# Patient Record
Sex: Female | Born: 1950 | Race: White | Hispanic: No | Smoking: Never smoker
Health system: Southern US, Academic
[De-identification: ages and names within clinical notes are randomized; demographics above are authoritative.]

## PROBLEM LIST (undated history)

## (undated) DIAGNOSIS — I10 Essential (primary) hypertension: Secondary | ICD-10-CM

## (undated) DIAGNOSIS — E079 Disorder of thyroid, unspecified: Secondary | ICD-10-CM

## (undated) DIAGNOSIS — H9190 Unspecified hearing loss, unspecified ear: Secondary | ICD-10-CM

## (undated) DIAGNOSIS — G4730 Sleep apnea, unspecified: Secondary | ICD-10-CM

## (undated) DIAGNOSIS — I2510 Atherosclerotic heart disease of native coronary artery without angina pectoris: Secondary | ICD-10-CM

## (undated) DIAGNOSIS — K219 Gastro-esophageal reflux disease without esophagitis: Secondary | ICD-10-CM

## (undated) HISTORY — DX: Atherosclerotic heart disease of native coronary artery without angina pectoris: I25.10

## (undated) HISTORY — DX: Sleep apnea, unspecified: G47.30

## (undated) HISTORY — DX: Unspecified hearing loss, unspecified ear: H91.90

## (undated) HISTORY — DX: Gastro-esophageal reflux disease without esophagitis: K21.9

## (undated) HISTORY — DX: Disorder of thyroid, unspecified: E07.9

## (undated) HISTORY — DX: Essential (primary) hypertension: I10

## (undated) HISTORY — PX: HX TUBAL LIGATION: SHX77

## (undated) HISTORY — PX: HX HYSTERECTOMY: SHX81

---

## 1992-10-20 ENCOUNTER — Other Ambulatory Visit (HOSPITAL_COMMUNITY): Payer: Self-pay

## 2022-03-09 IMAGING — MR MRI CERVICAL SPINE WITHOUT CONTRAST
4 of 6 series · 23 of 48 positions shown · non-contrast
Comparison: None previous.

﻿EXAM:  16737   MRI CERVICAL SPINE WITHOUT CONTRAST
INDICATION: 71-year-old with neck pain and bilateral shoulder pain.  Clinical diagnosis cervical myelopathy.
TECHNIQUE: Coronal, sagittal and axial images following standard protocol.  Some of the axial images are compromised in quality due to motion artifacts.  Overall quality is acceptable.

[Series 6: T2 · sagittal · 3.0mm · 0.75mm/px · 7 of 13 slices shown (1 of 2)]
[im 1/13]
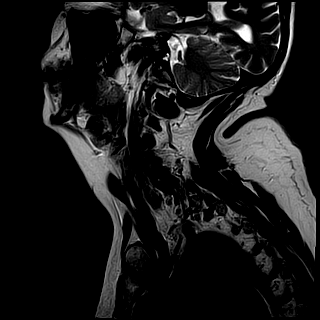
[im 3/13]
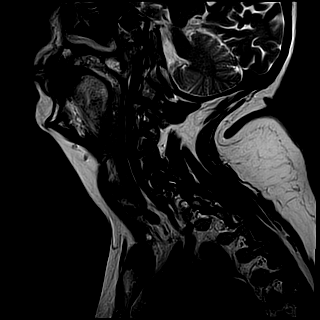
[im 5/13]
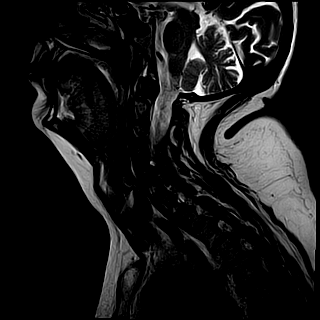
[im 7/13]
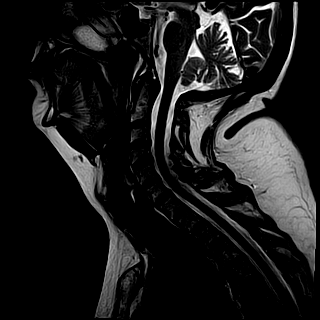
[im 9/13]
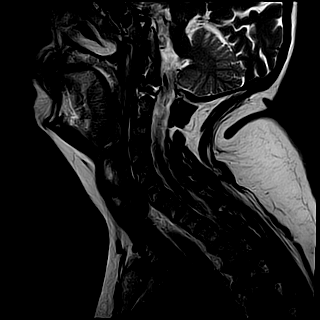
[im 11/13]
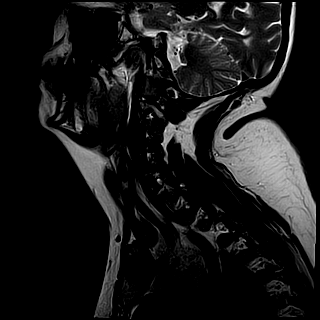
[im 13/13]
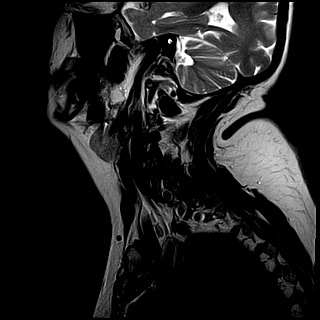

[Series 7: T1 · sagittal · 3.0mm · 0.47mm/px · 4 of 13 slices shown]
[im 1/13]
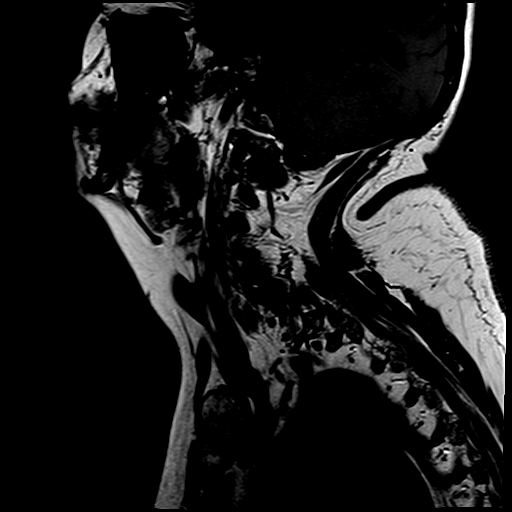
[im 3/13]
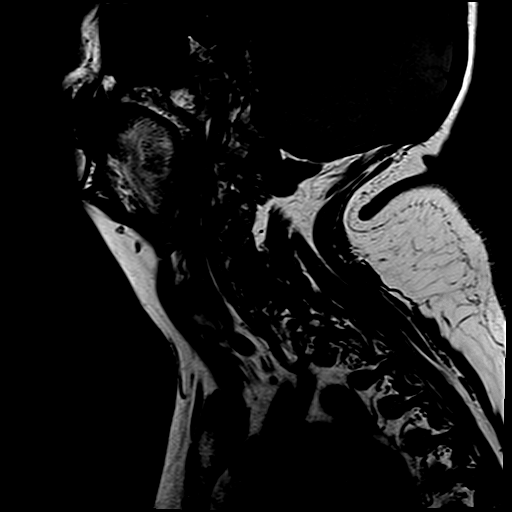
[im 7/13]
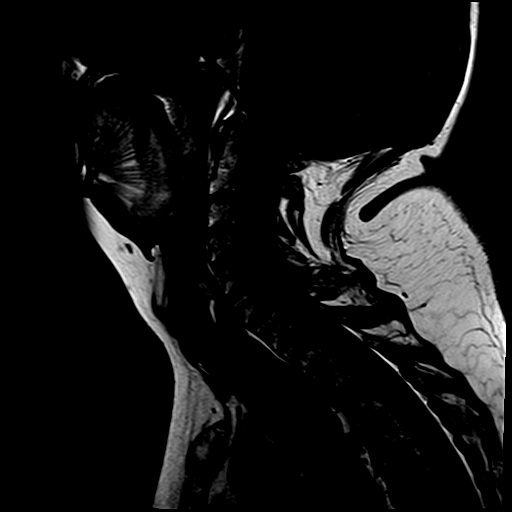
[im 11/13]
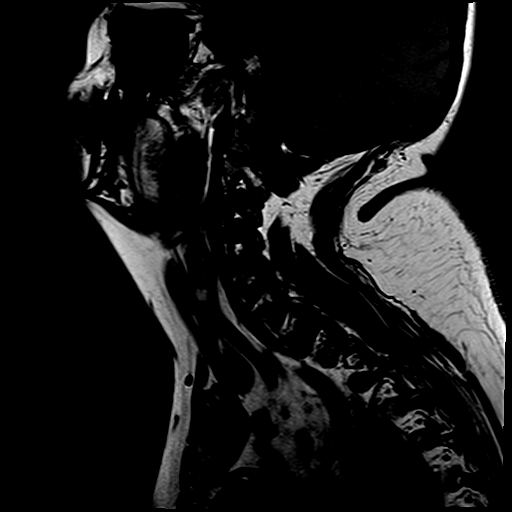

[Series 8: STIR · sagittal · 3.0mm · 0.47mm/px · 3 of 13 slices shown]
[im 3/13]
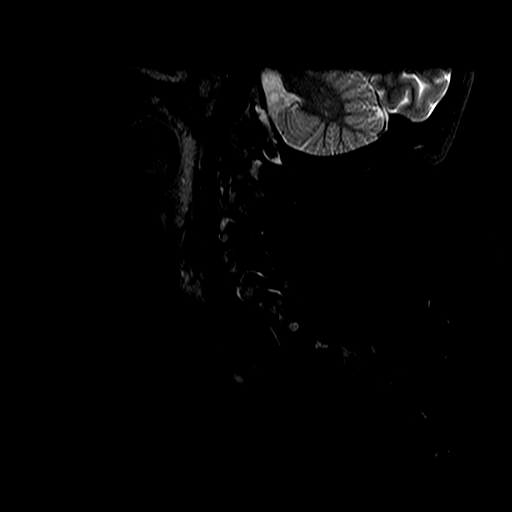
[im 7/13]
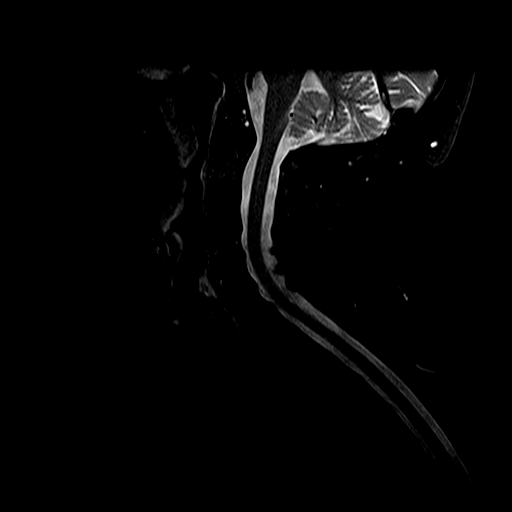
[im 11/13]
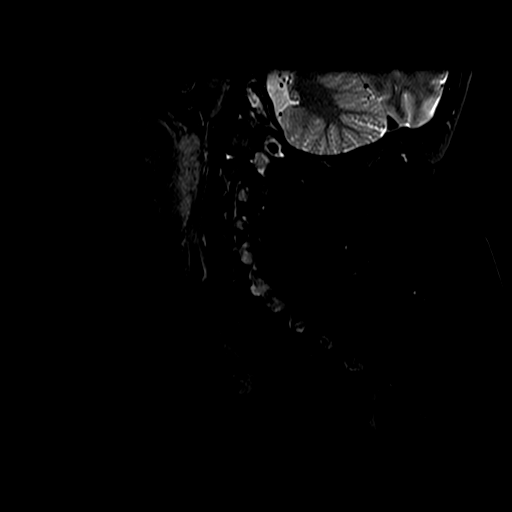

[Series 9: T2 · oblique · 3.0mm · 0.39mm/px · 9 of 18 slices shown (2 of 2)]
[im 1/18]
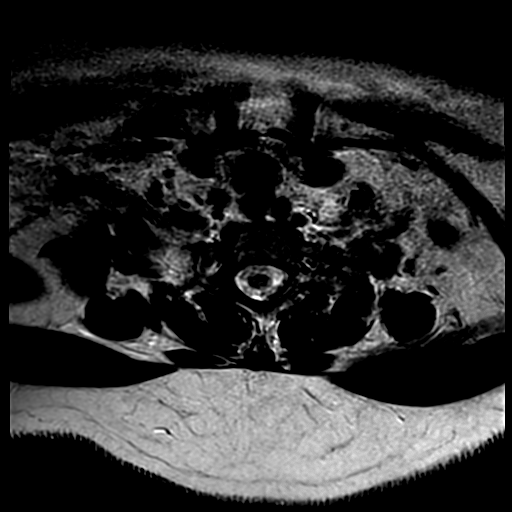
[im 3/18]
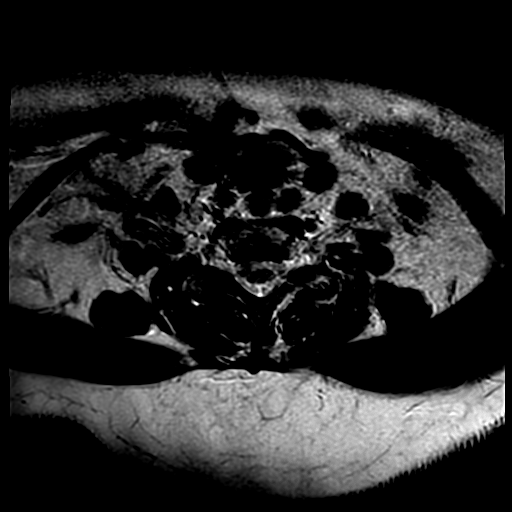
[im 5/18]
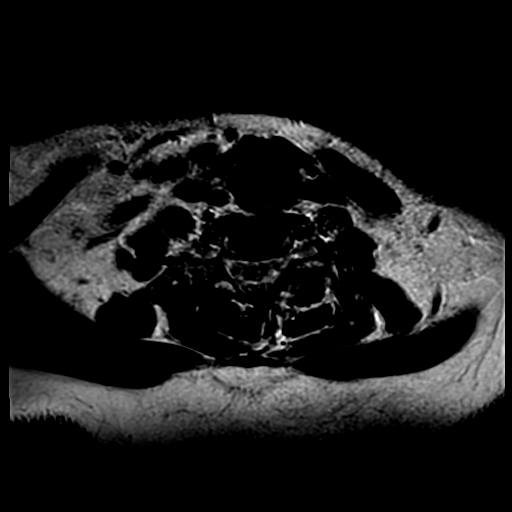
[im 7/18]
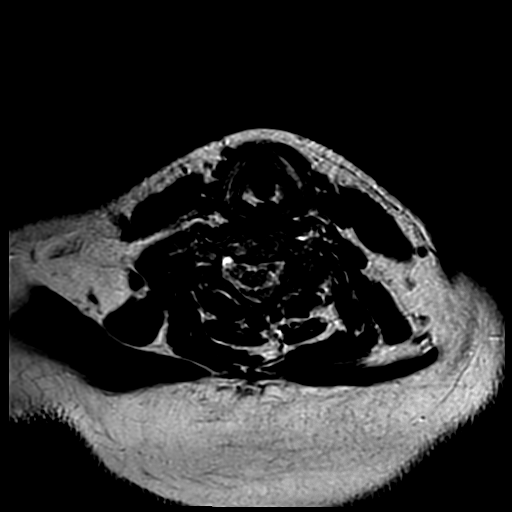
[im 9/18]
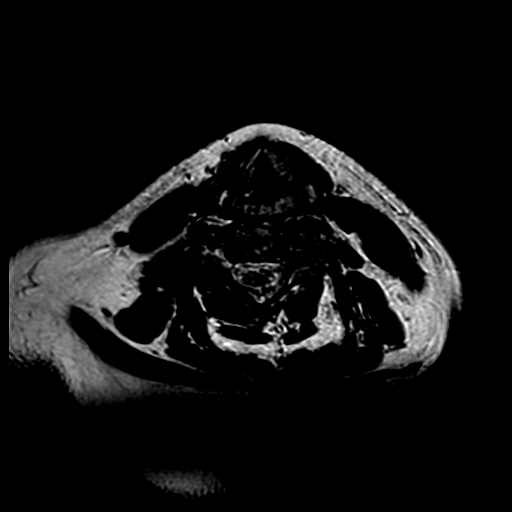
[im 11/18]
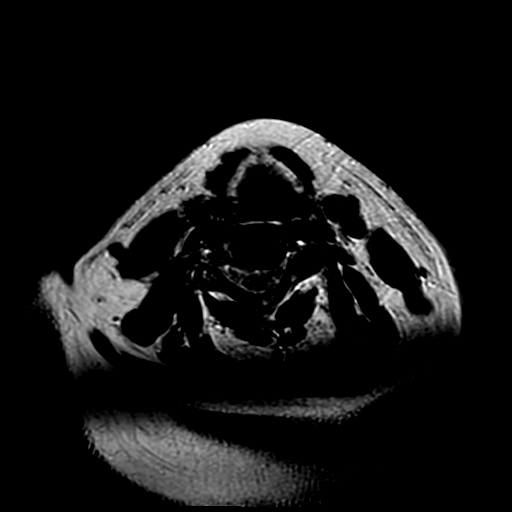
[im 13/18]
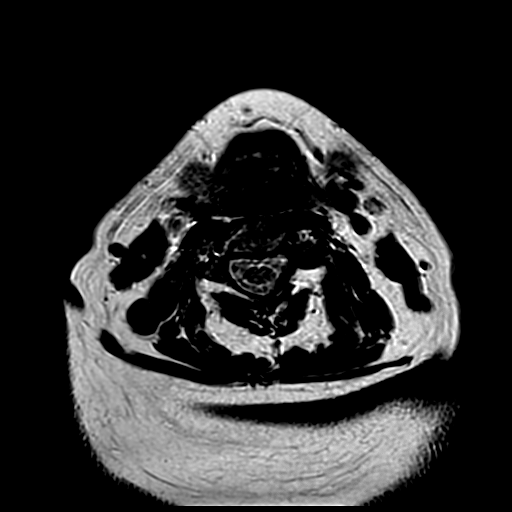
[im 15/18]
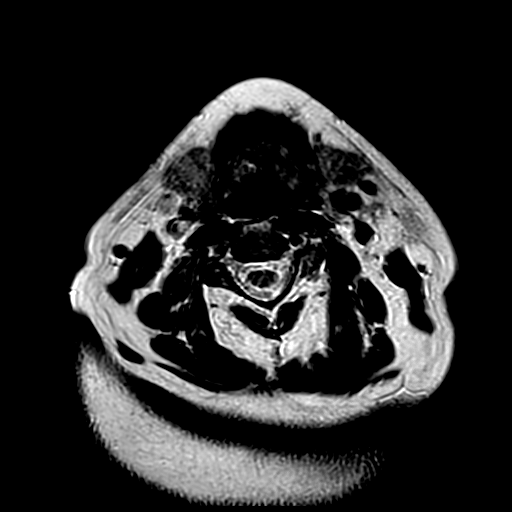
[im 18/18]
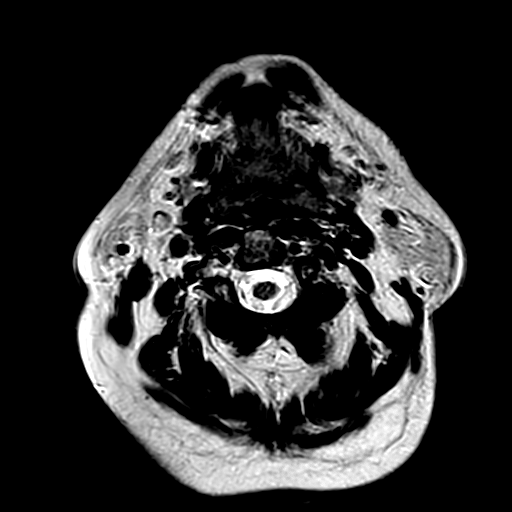

[23 of 48 positions shown; findings below may reference images not displayed]

FINDINGS: No acute bony lesions of cervical vertebrae.  Posterior fossa and foramen magnum structures are normal in the sagittal projection. 

No significant disc pathology at C2-C3 level.  

At C3-C4 level, mild degenerative disc disease without significant compromise of thecal sac.  Mild left foraminal narrowing is noted.  

At C4-C5 level, degenerative disc disease is causing moderate compromise of left neural foramen due to asymmetric bulging annulus.  

At C5-C6 level, degenerative disc disease with asymmetric bulging annulus causing mild left foraminal narrowing.  

At C6-C7 level, no focal disc lesions are seen.  

C7-T1 disc is normal.  

Cervical spinal cord shows no focal evidence of myelopathy or syrinx formation.  Paravertebral soft tissues are unremarkable.
IMPRESSION: 1. No MRI evidence of focal cervical cord myelopathy or cord syrinx formation. 

2. Degenerative changes at multiple levels as described above in detail at each level with mild to moderate foraminal narrowing on the left side as described above. 

3. Paravertebral soft tissues are unremarkable. 

Electronically Signed by GU, PITITTO at [id number] [DATE]

## 2022-03-22 IMAGING — MR MRI BRAIN W/O CONTRAST
9 series · 48 of 48 positions shown · non-contrast
Comparison: MRI C-spine dated 03/09/2022.

﻿EXAM:  MRI BRAIN W/O CONTRAST
INDICATION: 71-year-old female with memory impairment.  Ataxia.  Bilateral arm numbness.
TECHNIQUE: Multiplanar, multisequential MRI of the brain was performed without administration of contrast.

[Series 5: DWI · axial · 5.0mm · 1.35mm/px · z∈[-45,+81]mm · 16 of 88 slices shown (1 of 3)]
[im 1/88]
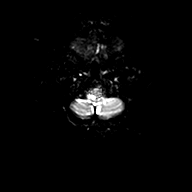
[im 6/88]
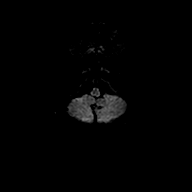
[im 12/88]
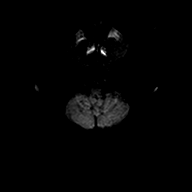
[im 18/88]
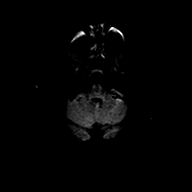
[im 24/88]
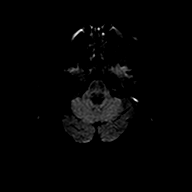
[im 30/88]
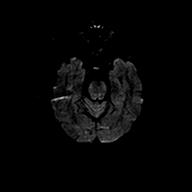
[im 35/88]
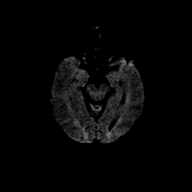
[im 41/88]
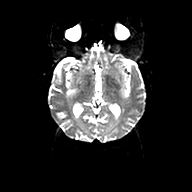
[im 47/88]
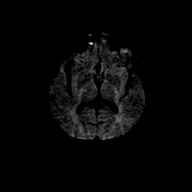
[im 53/88]
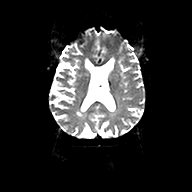
[im 59/88]
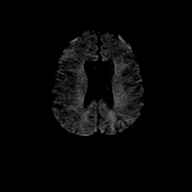
[im 64/88]
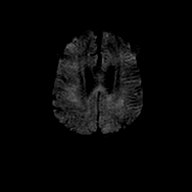
[im 70/88]
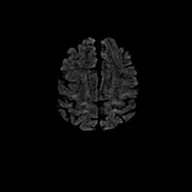
[im 76/88]
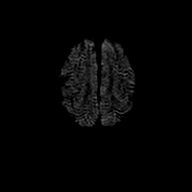
[im 82/88]
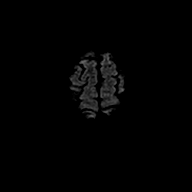
[im 88/88]
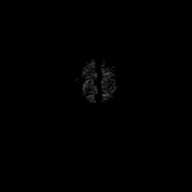

[Series 6: DWI · axial · 5.0mm · 1.35mm/px · z∈[-45,+81]mm · 4 of 22 slices shown (2 of 3)]
[im 1/22]
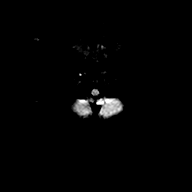
[im 8/22]
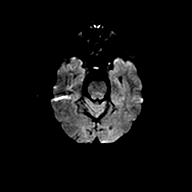
[im 15/22]
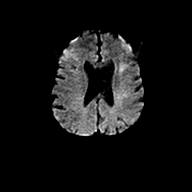
[im 22/22]
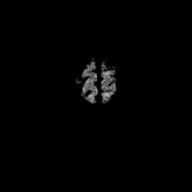

[Series 7: DWI · axial · 5.0mm · 1.35mm/px · z∈[-45,+81]mm · 4 of 22 slices shown (3 of 3)]
[im 1/22]
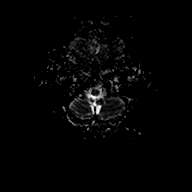
[im 8/22]
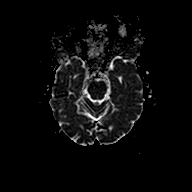
[im 15/22]
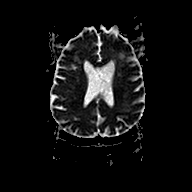
[im 22/22]
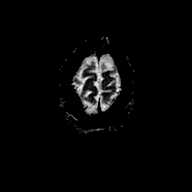

[Series 8: FLAIR · sagittal · 4.0mm · 0.75mm/px · 4 of 26 slices shown (1 of 2)]
[im 1/26]
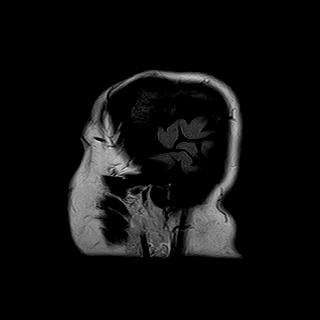
[im 9/26]
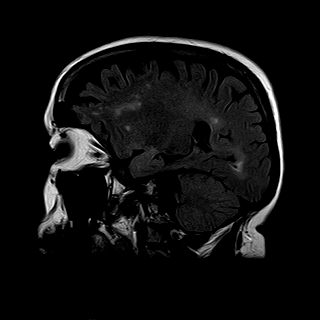
[im 17/26]
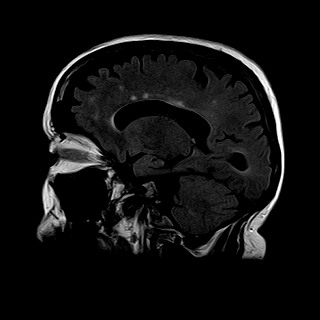
[im 26/26]
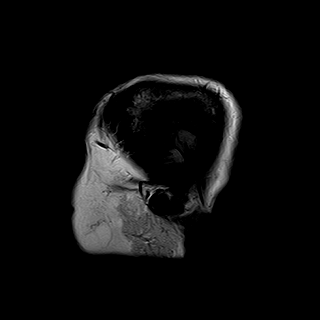

[Series 9: T2 · axial · 5.0mm · 0.43mm/px · z∈[-33,+109]mm · 4 of 25 slices shown (1 of 2)]
[im 1/25]
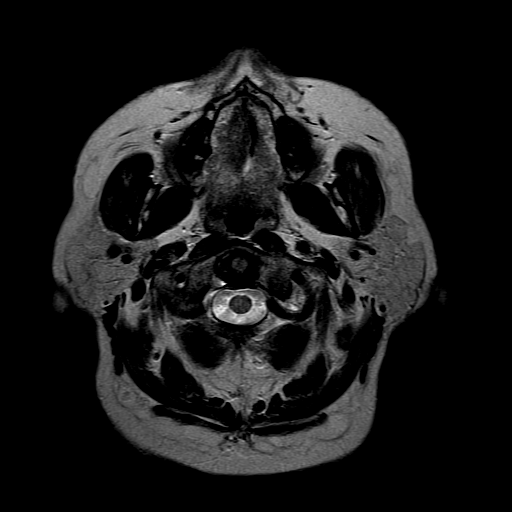
[im 9/25]
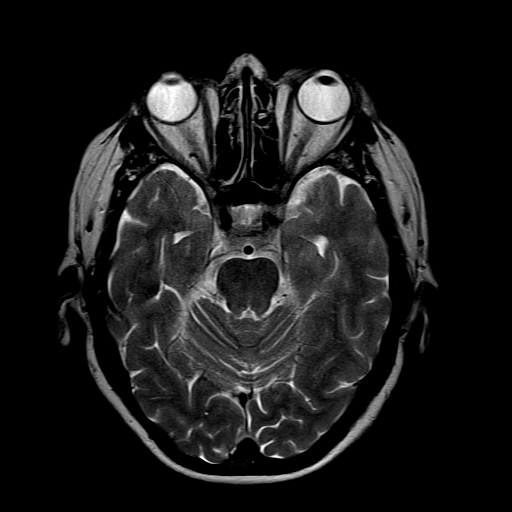
[im 17/25]
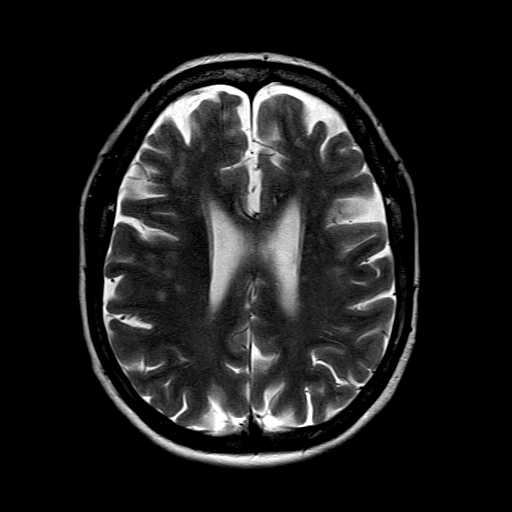
[im 25/25]
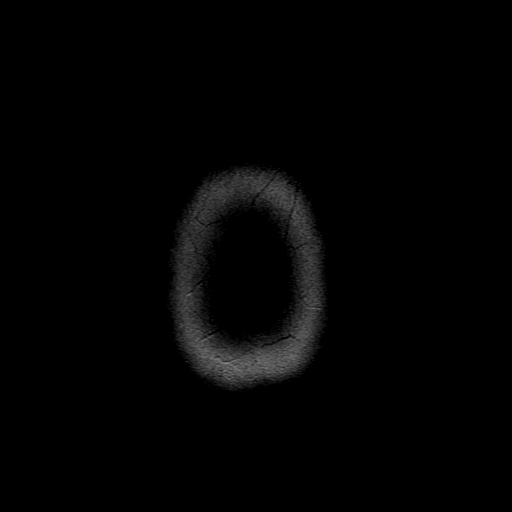

[Series 10: FLAIR · axial · 5.0mm · 0.76mm/px · z∈[-24,+100]mm · 4 of 22 slices shown (2 of 2)]
[im 1/22]
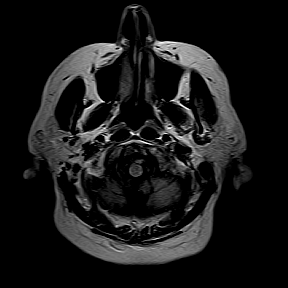
[im 8/22]
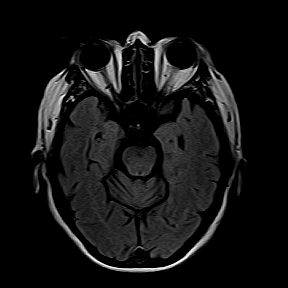
[im 15/22]
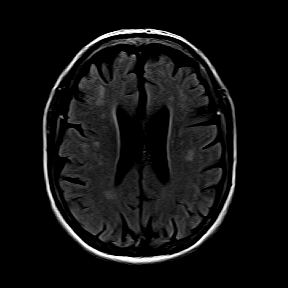
[im 22/22]
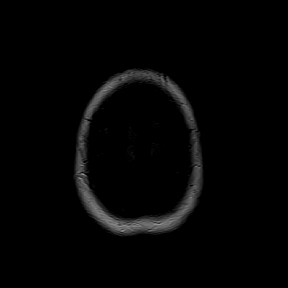

[Series 13: T1 · axial · 5.0mm · 0.69mm/px · z∈[-33,+109]mm · 4 of 25 slices shown]
[im 1/25]
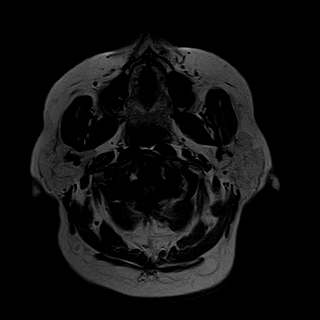
[im 9/25]
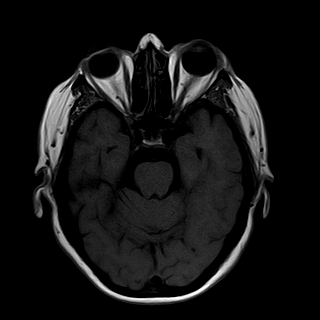
[im 17/25]
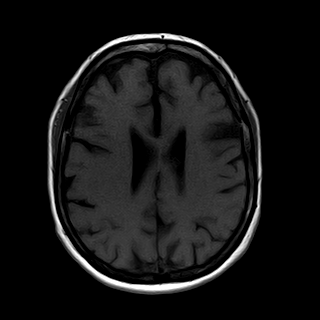
[im 25/25]
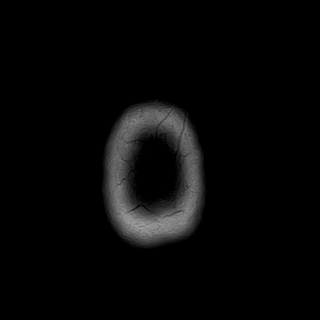

[Series 14: T2-star · axial · 5.0mm · 0.69mm/px · z∈[-33,+109]mm · 4 of 25 slices shown]
[im 1/25]
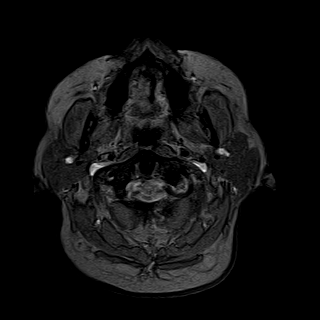
[im 9/25]
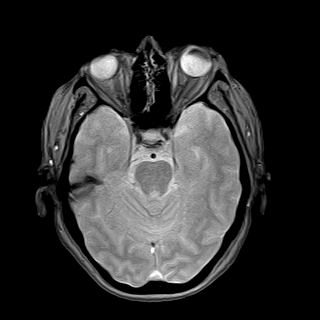
[im 17/25]
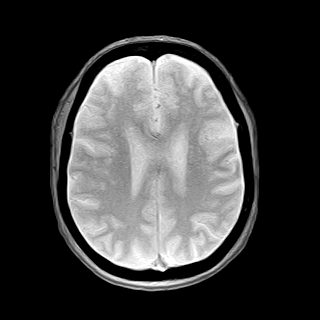
[im 25/25]
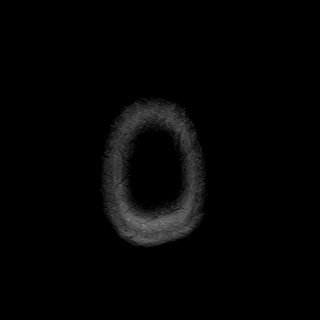

[Series 15: T2 · coronal · 6.0mm · 0.43mm/px · 4 of 24 slices shown (2 of 2)]
[im 1/24]
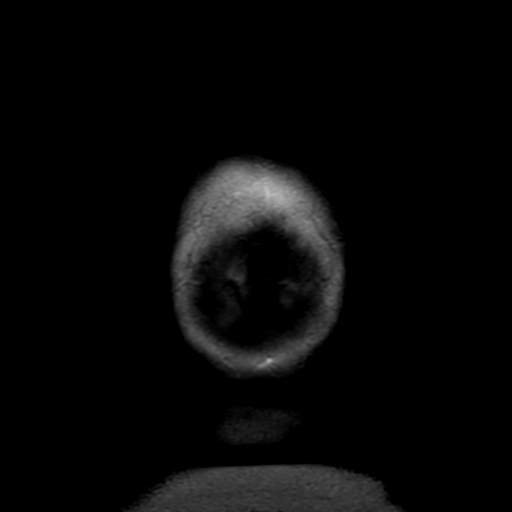
[im 8/24]
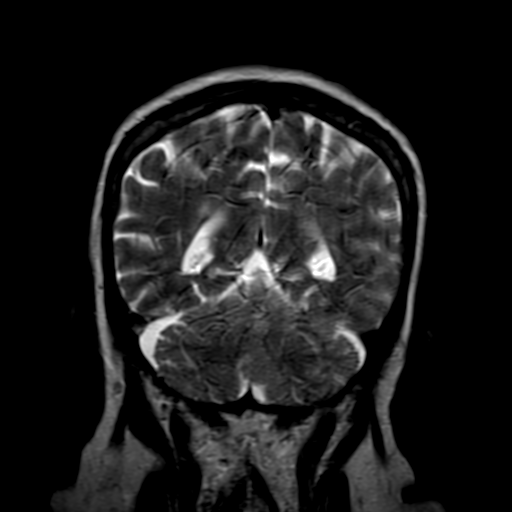
[im 16/24]
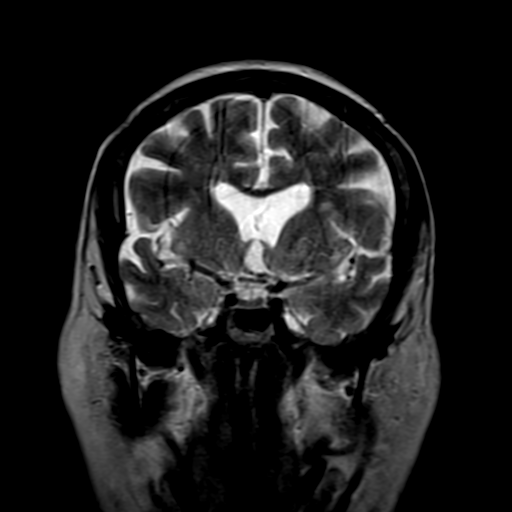
[im 24/24]
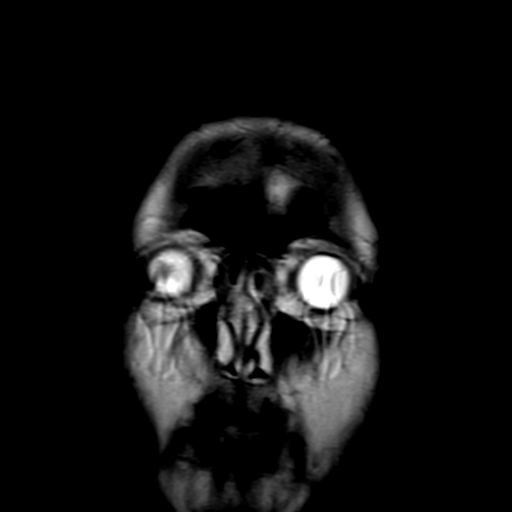

[48 of 48 positions shown; findings below may reference images not displayed]

FINDINGS: No focal areas of restricted diffusion on diffusion images.

On the sagittal and FLAIR sequence, extensive bilateral subcortical and periventricular FLAIR signal lesions are seen suggestive of likely bilateral chronic small-vessel ischemic change.  Lesions are not in typical pattern of multiple sclerosis.

Age-appropriate cerebral atrophy and cerebellar atrophy are noted.  No ventriculomegaly or midline shift.

Major arteries of circle of Willis and dural venous sinuses are patent.
IMPRESSION: 1. No intracranial space-occupying lesions or ventriculomegaly.  Age-appropriate symmetric cerebral and cerebellar atrophy. 

2. Bilateral FLAIR bright lesions in the subcortical periventricular white matter likely due to small-vessel ischemic changes.  No typical changes of multiple sclerosis are seen.

3. Major arteries of circle of Willis and dural venous sinuses are patent.

## 2022-06-05 ENCOUNTER — Ambulatory Visit (HOSPITAL_PSYCHIATRIC): Payer: Self-pay | Admitting: PHYSICIAN ASSISTANT

## 2022-06-30 ENCOUNTER — Encounter (HOSPITAL_PSYCHIATRIC): Payer: Self-pay | Admitting: PHYSICIAN ASSISTANT

## 2022-06-30 ENCOUNTER — Ambulatory Visit: Payer: Medicare Other | Attending: PHYSICIAN ASSISTANT | Admitting: PHYSICIAN ASSISTANT

## 2022-06-30 ENCOUNTER — Other Ambulatory Visit: Payer: Self-pay

## 2022-06-30 VITALS — BP 135/55 | HR 84 | Resp 18 | Ht 61.02 in | Wt 156.0 lb

## 2022-06-30 DIAGNOSIS — F339 Major depressive disorder, recurrent, unspecified: Secondary | ICD-10-CM | POA: Insufficient documentation

## 2022-06-30 DIAGNOSIS — Z79899 Other long term (current) drug therapy: Secondary | ICD-10-CM | POA: Insufficient documentation

## 2022-06-30 DIAGNOSIS — F411 Generalized anxiety disorder: Secondary | ICD-10-CM | POA: Insufficient documentation

## 2022-06-30 DIAGNOSIS — F331 Major depressive disorder, recurrent, moderate: Secondary | ICD-10-CM | POA: Insufficient documentation

## 2022-06-30 MED ORDER — BUSPIRONE 15 MG TABLET
15.0000 mg | ORAL_TABLET | Freq: Three times a day (TID) | ORAL | 1 refills | Status: DC
Start: 2022-06-30 — End: 2022-10-16

## 2022-06-30 MED ORDER — ARIPIPRAZOLE 2 MG TABLET
2.0000 mg | ORAL_TABLET | Freq: Every day | ORAL | 1 refills | Status: DC
Start: 2022-06-30 — End: 2022-10-16

## 2022-06-30 MED ORDER — DULOXETINE 60 MG CAPSULE,DELAYED RELEASE
60.0000 mg | DELAYED_RELEASE_CAPSULE | Freq: Every day | ORAL | 1 refills | Status: DC
Start: 2022-06-30 — End: 2022-10-16

## 2022-06-30 MED ORDER — LORAZEPAM 1 MG TABLET
1.0000 mg | ORAL_TABLET | Freq: Three times a day (TID) | ORAL | 2 refills | Status: DC
Start: 2022-06-30 — End: 2022-10-16

## 2022-06-30 NOTE — Patient Instructions (Signed)
Take medications as prescribed, avoid drugs and alcohol, call office if symptoms worsen or problems arise.  [phone].
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
## 2022-09-27 ENCOUNTER — Other Ambulatory Visit: Payer: Self-pay

## 2022-09-27 ENCOUNTER — Encounter (INDEPENDENT_AMBULATORY_CARE_PROVIDER_SITE_OTHER): Payer: Self-pay | Admitting: OTOLARYNGOLOGY

## 2022-09-27 ENCOUNTER — Ambulatory Visit (INDEPENDENT_AMBULATORY_CARE_PROVIDER_SITE_OTHER): Payer: Medicare Other | Admitting: OTOLARYNGOLOGY

## 2022-09-27 VITALS — Ht 61.0 in | Wt 155.0 lb

## 2022-09-27 DIAGNOSIS — J3089 Other allergic rhinitis: Secondary | ICD-10-CM

## 2022-09-27 DIAGNOSIS — R1310 Dysphagia, unspecified: Secondary | ICD-10-CM

## 2022-09-27 DIAGNOSIS — K219 Gastro-esophageal reflux disease without esophagitis: Secondary | ICD-10-CM

## 2022-09-27 NOTE — H&P (Signed)
ENT, [facility]  [address]  [place] [address]        Name: Jenna Gibson MRN:  [id number]   Date: 09/27/2022 DOB: Oct 03, 1950 (72 y.o.)       Referring Provider:  No ref. provider found    Reason for Visit:   Chief Complaint   Patient presents with    Dysphagia     New patient here for occ dysphagia. Reports feeling of getting stuck in thoat        History of Present Illness:  Jenna Gibson is a 72 y.o. female who is referred for eval of throat. Pt c/o dysphagia to solids x 2-3 months. Asso with severe heartburn with pain radiaiting across her chest. Taking Rolaids PRN. She used to take Pepcid and Protonix but ran out more than a year ago. She had an EGD 4-5 years ago and states she needs another.   Denies hoarseness      Patient History:  Patient Active Problem List   Diagnosis    Recurrent major depressive disorder (CMS HCC)    GAD (generalized anxiety disorder)     Current Outpatient Medications   Medication Sig    ARIPiprazole (ABILIFY) 2 mg Oral Tablet Take 1 Tablet (2 mg total) by mouth Once a day    atorvastatin (LIPITOR) 10 mg Oral Tablet Take 1 Tablet (10 mg total) by mouth Once a day    busPIRone (BUSPAR) 15 mg Oral Tablet Take 1 Tablet (15 mg total) by mouth Three times a day    clopidogreL (PLAVIX) 75 mg Oral Tablet Take 1 Tablet (75 mg total) by mouth Once a day    cyclobenzaprine (FLEXERIL) 10 mg Oral Tablet Take 1 Tablet (10 mg total) by mouth Every 8 hours as needed for Muscle spasms    dilTIAZem (CARDIZEM CD) 180 mg Oral Capsule, Sust. Release 24 hr Take 1 Capsule (180 mg total) by mouth Once a day    DULoxetine (CYMBALTA DR) 60 mg Oral Capsule, Delayed Release(E.C.) Take 1 Capsule (60 mg total) by mouth Once a day    famotidine (PEPCID) 40 mg Oral Tablet Take 1 Tablet (40 mg total) by mouth Every evening    furosemide (LASIX) 40 mg Oral Tablet Take 1 Tablet (40 mg total) by mouth Once a day    glimepiride (AMARYL) 2 mg Oral Tablet Take 1 Tablet (2 mg total) by mouth Every  morning with breakfast    Irbesartan-Hydrochlorothiazide 300-12.5 mg Oral Tablet Take 1 Tablet by mouth Once a day    levothyroxine (SYNTHROID) 50 mcg Oral Tablet Take 1 Tablet (50 mcg total) by mouth Every morning    LORazepam (ATIVAN) 1 mg Oral Tablet Take 1 Tablet (1 mg total) by mouth Three times a day    nitroGLYCERIN (NITROSTAT) 0.4 mg Sublingual Tablet, Sublingual Place 1 Tablet (0.4 mg total) under the tongue Every 5 minutes as needed for Chest pain for 3 doses over 15 minutes    ondansetron (ZOFRAN) 4 mg Oral Tablet Take 1 Tablet (4 mg total) by mouth Every 8 hours as needed    pantoprazole (PROTONIX) 40 mg Oral Tablet, Delayed Release (E.C.) Take 1 Tablet (40 mg total) by mouth Once a day    pregabalin (LYRICA) 75 mg Oral Capsule Take 1 Capsule (75 mg total) by mouth Twice daily      Allergies   Allergen Reactions    Butorphanol Mental Status Effect    Codeine Rash    Penicillins Rash  Sulfa (Sulfonamides) Rash and Nausea/ Vomiting    Crestor [Rosuvastatin]  Other Adverse Reaction (Add comment)     "Can't remember"    Penciclovir      Past Medical History:   Diagnosis Date    Coronary artery disease     Esophageal reflux     Essential hypertension     Hearing loss     Sleep apnea     Thyroid disease      Past Surgical History:   Procedure Laterality Date    HX HYSTERECTOMY      HX TUBAL LIGATION       Family Medical History:       Problem Relation (Age of Onset)    Cancer Other    Diabetes Other    Sleep apnea Other            Social History     Tobacco Use    Smoking status: Never    Smokeless tobacco: Never   Vaping Use    Vaping status: Never Used   Substance Use Topics    Alcohol use: Never    Drug use: Never       Review of Systems:  Review of Systems    Physical Exam:  Ht 1.549 m (5\' 1" )   Wt 70.3 kg (155 lb)   BMI 29.29 kg/m       Physical Exam  Constitutional:       Appearance: Normal appearance. She is well-developed, well-groomed and normal weight.   HENT:      Head: Normocephalic and  atraumatic.      Right Ear: Hearing, tympanic membrane, ear canal and external ear normal.      Left Ear: Hearing, tympanic membrane, ear canal and external ear normal.      Nose: Septal deviation and mucosal edema present.      Right Turbinates: Enlarged.      Left Turbinates: Enlarged.      Mouth/Throat:      Lips: Pink.      Mouth: Mucous membranes are moist.      Pharynx: Oropharynx is clear. Uvula midline.   Eyes:      Extraocular Movements: Extraocular movements intact.   Neck:      Trachea: Phonation normal.   Pulmonary:      Effort: Pulmonary effort is normal.   Musculoskeletal:      Cervical back: Normal range of motion and neck supple.   Lymphadenopathy:      Cervical: No cervical adenopathy.   Skin:     General: Skin is warm.   Neurological:      Mental Status: She is alert and oriented to person, place, and time.      Cranial Nerves: Cranial nerves 2-12 are intact. No facial asymmetry.   Psychiatric:         Attention and Perception: Attention normal.         Mood and Affect: Mood normal.         Speech: Speech normal.         Behavior: Behavior normal. Behavior is cooperative.          Assessment:  ENCOUNTER DIAGNOSES     ICD-10-CM   1. Dysphagia, unspecified type  R13.10   2. 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 Non-seasonal allergic rhinitis due to other allergic trigger  J30.89       Plan:  Medical records reviewed on 09/27/2022.  Rx Pantoprazole and Pepcid. Discussed antireflux precautions.  Order MBS/SLP eval  Refer to GI Dr. Allena Katz dysphagia.   Orders Placed This Encounter    31575 - LARYNGOSCOPY, FLEXIBLE DIAGNOSTIC (AMB ONLY)    FLUORO ESOPHAGRAM, MODIFIED SWALLOW    Referral to External Provider    famotidine (PEPCID) 40 mg Oral Tablet    pantoprazole (PROTONIX) 40 mg Oral Tablet, Delayed Release (E.C.)     Follow-up after MBS or sooner PRN    The advanced practice clinician's documentation was reviewed/amended in its entirety with the assessment and plan portion completely performed  independently by me during this encounter.   Lonia Farber, D.O., MMS    Marcelline Deist, PA-C  ENT / Facial Plastic Surgery    I appreciate the opportunity to be involved in the care of your patients.  If you have any questions or concerns regarding this encounter, please do not hesitate to contact me at your convenience.        This note may have been partially generated using MModal Fluency Direct system, and there may be some incorrect words, spellings, and punctuation that were not noted in checking the note before saving, though effort was made to avoid such errors.

## 2022-09-28 ENCOUNTER — Telehealth (INDEPENDENT_AMBULATORY_CARE_PROVIDER_SITE_OTHER): Payer: Self-pay | Admitting: OTOLARYNGOLOGY

## 2022-09-28 ENCOUNTER — Ambulatory Visit (HOSPITAL_PSYCHIATRIC): Payer: Medicare Other | Admitting: PHYSICIAN ASSISTANT

## 2022-09-28 MED ORDER — PANTOPRAZOLE 40 MG TABLET,DELAYED RELEASE
40.0000 mg | DELAYED_RELEASE_TABLET | Freq: Every day | ORAL | 3 refills | Status: AC
Start: 2022-09-28 — End: ?

## 2022-09-28 MED ORDER — FAMOTIDINE 40 MG TABLET
40.0000 mg | ORAL_TABLET | Freq: Every evening | ORAL | 3 refills | Status: AC
Start: 2022-09-28 — End: ?

## 2022-10-12 ENCOUNTER — Ambulatory Visit (HOSPITAL_COMMUNITY): Payer: Self-pay

## 2022-10-16 ENCOUNTER — Ambulatory Visit (HOSPITAL_PSYCHIATRIC): Payer: Medicare Other | Admitting: PHYSICIAN ASSISTANT

## 2022-10-16 ENCOUNTER — Ambulatory Visit: Payer: Medicare Other | Attending: PHYSICIAN ASSISTANT | Admitting: PHYSICIAN ASSISTANT

## 2022-10-16 ENCOUNTER — Other Ambulatory Visit: Payer: Self-pay

## 2022-10-16 ENCOUNTER — Encounter (HOSPITAL_PSYCHIATRIC): Payer: Self-pay | Admitting: PHYSICIAN ASSISTANT

## 2022-10-16 VITALS — BP 118/58 | HR 74 | Resp 18 | Ht 61.0 in | Wt 155.0 lb

## 2022-10-16 DIAGNOSIS — F411 Generalized anxiety disorder: Secondary | ICD-10-CM | POA: Insufficient documentation

## 2022-10-16 DIAGNOSIS — F331 Major depressive disorder, recurrent, moderate: Secondary | ICD-10-CM | POA: Insufficient documentation

## 2022-10-16 MED ORDER — LORAZEPAM 1 MG TABLET
1.0000 mg | ORAL_TABLET | Freq: Three times a day (TID) | ORAL | 3 refills | Status: DC
Start: 2022-10-16 — End: 2023-01-16

## 2022-10-16 MED ORDER — ARIPIPRAZOLE 2 MG TABLET
2.0000 mg | ORAL_TABLET | Freq: Every day | ORAL | 1 refills | Status: DC
Start: 2022-10-16 — End: 2023-01-16

## 2022-10-16 MED ORDER — DULOXETINE 60 MG CAPSULE,DELAYED RELEASE
60.0000 mg | DELAYED_RELEASE_CAPSULE | Freq: Every day | ORAL | 1 refills | Status: DC
Start: 2022-10-16 — End: 2023-01-16

## 2022-10-16 MED ORDER — BUSPIRONE 15 MG TABLET
15.0000 mg | ORAL_TABLET | Freq: Three times a day (TID) | ORAL | 1 refills | Status: DC
Start: 2022-10-16 — End: 2023-01-16

## 2022-10-16 NOTE — Patient Instructions (Signed)
Take medications as prescribed, avoid drugs and alcohol, call office if symptoms worsen or problems arise.  [phone].

## 2022-10-16 NOTE — Progress Notes (Signed)
BEHAVIORAL MEDICINE, [facility]  [address]  [place] [address]  Operated by [facility]  Progress Note    Name: Jenna Gibson MRN:  [id number]   Date: 10/16/2022 DOB:  1951/01/03 (72 y.o.)           Chief Complaint: Major Depression and Generalized Anxiety    Subjective:   Patient reports that things are going "mediocre". She had a good Mother's Day. She and her daughter went out to eat. A week after that, her grandson gave them presents from [place]. She has another great-grandbaby, her third. It is a little boy, and they named him River She is enjoying the summertime and enjoys planting roses.  She can't use the shovel, so the soft spots is where she put the roses and some are still in their pots. She has noted some unsteadiness at times during the day. She has been to ENT and he sent her for a Barium swallow on June 19th, after he couldn't see anything on the scope. She has a cardiologist and is scheduled to see a new pulmonologist in [place], as hers moved to [place].   Mood: "happy"  Medications: Working well with no ill effects.   Appetite: Eating well with a good appetite.   Sleep She is sleeping "fine" with no difficulties or nightmares.   Energy: "lost it".  Stressors: She is going to see her grandson get married in September and is hopeful of getting to the beach this summer.     Patient Active Problem List   Diagnosis    Recurrent major depressive disorder (CMS HCC)    GAD (generalized anxiety disorder)     Past Medical History:   Diagnosis Date    Coronary artery disease     Esophageal reflux     Essential hypertension     Hearing loss     Sleep apnea     Thyroid disease      Past Surgical History:   Procedure Laterality Date    HX HYSTERECTOMY      HX TUBAL LIGATION       Family History   Problem Relation Name Age of Onset    Cancer Other      Sleep apnea Other      Diabetes Other       Social History     Socioeconomic History     Marital status: Widowed   Tobacco Use    Smoking status: Never    Smokeless tobacco: Never   Vaping Use    Vaping status: Never Used   Substance and Sexual Activity    Alcohol use: Never    Drug use: Never      Butorphanol, Codeine, Penicillins, Sulfa (sulfonamides), Crestor [rosuvastatin], and Penciclovir   Current Outpatient Medications   Medication Sig    ARIPiprazole (ABILIFY) 2 mg Oral Tablet Take 1 Tablet (2 mg total) by mouth Once a day    atorvastatin (LIPITOR) 10 mg Oral Tablet Take 1 Tablet (10 mg total) by mouth Once a day    busPIRone (BUSPAR) 15 mg Oral Tablet Take 1 Tablet (15 mg total) by mouth Three times a day    clopidogreL (PLAVIX) 75 mg Oral Tablet Take 1 Tablet (75 mg total) by mouth Once a day    cyclobenzaprine (FLEXERIL) 10 mg Oral Tablet Take 1 Tablet (10 mg total) by mouth Every 8 hours as needed for Muscle spasms    dilTIAZem (CARDIZEM CD) 180  mg Oral Capsule, Sust. Release 24 hr Take 1 Capsule (180 mg total) by mouth Once a day    DULoxetine (CYMBALTA DR) 60 mg Oral Capsule, Delayed Release(E.C.) Take 1 Capsule (60 mg total) by mouth Once a day    famotidine (PEPCID) 40 mg Oral Tablet Take 1 Tablet (40 mg total) by mouth Every evening    furosemide (LASIX) 40 mg Oral Tablet Take 1 Tablet (40 mg total) by mouth Once a day    glimepiride (AMARYL) 2 mg Oral Tablet Take 1 Tablet (2 mg total) by mouth Every morning with breakfast    Irbesartan-Hydrochlorothiazide 300-12.5 mg Oral Tablet Take 1 Tablet by mouth Once a day    levothyroxine (SYNTHROID) 50 mcg Oral Tablet Take 1 Tablet (50 mcg total) by mouth Every morning    LORazepam (ATIVAN) 1 mg Oral Tablet Take 1 Tablet (1 mg total) by mouth Three times a day    nitroGLYCERIN (NITROSTAT) 0.4 mg Sublingual Tablet, Sublingual Place 1 Tablet (0.4 mg total) under the tongue Every 5 minutes as needed for Chest pain for 3 doses over 15 minutes    ondansetron (ZOFRAN) 4 mg Oral Tablet Take 1 Tablet (4 mg total) by mouth Every 8 hours as needed     pantoprazole (PROTONIX) 40 mg Oral Tablet, Delayed Release (E.C.) Take 1 Tablet (40 mg total) by mouth Once a day    pregabalin (LYRICA) 75 mg Oral Capsule Take 1 Capsule (75 mg total) by mouth Twice daily      Objective :  BP (!) 118/58 (Site: Left, Patient Position: Sitting)   Pulse 74   Resp 18   Ht 1.549 m (5\' 1" )   Wt 70.3 kg (155 lb)   BMI 29.29 kg/m       PHQ Total Score  PHQ 2 Total: 3  PHQ 9 Total: 8  Interpretation of Total Score: 5-9 Mild depression             06/30/2022    11:52 AM 10/16/2022     2:00 PM   Most Recent PHQ-9 Scores   PHQ 9 Total 9 8        Mental Status Exam  AXOX4. Casual dress, calm, well-groomed. No SI, HI, AVH, delusions, or paranoia. Thoughts are logical, coherent, and goal-directed. Good eye contact. Speech is normal in rate and tone. Mood is "okay" affect congruent. No psychomotor agitation or retardation, cogwheel rigidity, or abnormal movements. Gait is normal. Attention, concentration, and memory are good. No cognitive deficits noted. Judgment and insight are fair. Calculation and abstraction are within normal limits.     Data Reviewed  I have reviewed patient's previous note medical, surgical, family, and social history in detail today,     Assessment  Major Depression and Generalized Anxiety    Plan  Continue the following medications:  Abilify 2 mg daily for mood.  Buspar 15 mg tid for mood.  Cymbalta 60 mg daily for mood.  Ativan 1 mg tid for anxiety.        Follow up  Return to clinic in 3 months.    Maren Beach, PA-C

## 2022-10-19 ENCOUNTER — Telehealth (INDEPENDENT_AMBULATORY_CARE_PROVIDER_SITE_OTHER): Payer: Self-pay | Admitting: OTOLARYNGOLOGY

## 2022-10-22 NOTE — Procedures (Signed)
[facility], [facility]  [address]  [place] [address]    Procedure Note    Name: KIRSTAN FENTRESS MRN:  [id number]   Date: 09/27/2022 DOB:  21-Jun-1950 (71 y.o.)         31575 - LARYNGOSCOPY, FLEXIBLE DIAGNOSTIC (AMB ONLY)    Performed by: Lonia Farber, DO  Authorized by: Lonia Farber, DO    Time Out:     Immediately before the procedure, a time out was called:  Yes    Patient verified:  Yes    Procedure Verified:  Yes    Site Verified:  Yes  Indications for procedure: Unable to fully visualize laryngeal anatomy on indirect laryngoscopy, Dysphagia, and GERD / LPR management    Anesthesia:  Topical lidocaine/ phenylephrine     Description: The flexible endoscope was gently introduced into the nostril and passed along the floor of the nose to the nasopharynx. Adenoid pad and Eustachian tube orifices. The retropalatal airway was patent.    The endoscope was passed to the oropharynx.  Base of tongue/lingual tonsils without masses or lesions, patent valelulla, and sharply defined upright epiglottis. Retrolingual airway was patent.    The larynx displayed normal true vocal cords with good mobility. False cords were normal. Arytenoid mucosa was mildly erythematous and edematous.    The piriform recesses were symmetric without secretion. The hypopharynx was symmetric without lesion.    Findings: Laryngopharyngeal Reflux    The patient tolerated the procedure well    This note may have been partially generated using MModal Fluency Direct system, and there may be some incorrect words, spellings, and punctuation that were not noted in checking the note before saving, though effort was made to avoid such errors.      Lonia Farber, DO

## 2022-11-01 ENCOUNTER — Encounter (INDEPENDENT_AMBULATORY_CARE_PROVIDER_SITE_OTHER): Payer: Self-pay | Admitting: OTOLARYNGOLOGY

## 2022-11-29 ENCOUNTER — Telehealth (INDEPENDENT_AMBULATORY_CARE_PROVIDER_SITE_OTHER): Payer: Self-pay | Admitting: NURSE PRACTITIONER

## 2022-11-29 NOTE — Telephone Encounter (Signed)
Called pt to give appt on 12/26/22 at 0800, [facility] hematology visit, referral from her PCP Dorathy Kinsman, NP. Directions to new clinic given, and phone number to call us if inconvenient date/time. No answer, lvm with all information.  Will fax appt date/time to [facility] clinic.

## 2022-12-09 ENCOUNTER — Emergency Department (HOSPITAL_COMMUNITY): Payer: Medicare Other

## 2022-12-09 ENCOUNTER — Other Ambulatory Visit: Payer: Self-pay

## 2022-12-09 ENCOUNTER — Emergency Department
Admission: EM | Admit: 2022-12-09 | Discharge: 2022-12-09 | Disposition: A | Discharge status: Home / Self Care | Payer: Medicare Other | Attending: Family | Admitting: Family

## 2022-12-09 DIAGNOSIS — M25552 Pain in left hip: Secondary | ICD-10-CM

## 2022-12-09 DIAGNOSIS — G8929 Other chronic pain: Secondary | ICD-10-CM | POA: Insufficient documentation

## 2022-12-09 MED ORDER — NAPROXEN 250 MG TABLET
500.0000 mg | ORAL_TABLET | ORAL | Status: AC
Start: 2022-12-09 — End: 2022-12-09
  Administered 2022-12-09: 500 mg via ORAL

## 2022-12-09 MED ORDER — ACETAMINOPHEN 325 MG TABLET
ORAL_TABLET | ORAL | Status: AC
Start: 2022-12-09 — End: 2022-12-09
  Filled 2022-12-09: qty 2

## 2022-12-09 MED ORDER — NAPROXEN 250 MG TABLET
ORAL_TABLET | ORAL | Status: AC
Start: 2022-12-09 — End: 2022-12-09
  Filled 2022-12-09: qty 2

## 2022-12-09 MED ORDER — ACETAMINOPHEN 325 MG TABLET
650.0000 mg | ORAL_TABLET | ORAL | Status: AC
Start: 2022-12-09 — End: 2022-12-09
  Administered 2022-12-09: 650 mg via ORAL

## 2022-12-09 NOTE — ED Nurses Note (Addendum)
A full assessment was not completed on the patient. The patient did not have a primary nurse and was seen and discharged from the waiting room. The discharge instructions was given to the patient and all the questions were answered. Patient ambulated out of the waiting room with family

## 2022-12-09 NOTE — ED Triage Notes (Signed)
Left groin pain x1 month. Seen at PCP and given Toradol injection.

## 2022-12-09 NOTE — ED Provider Notes (Signed)
[facility]  ED Physician Note      Arrival: Car    Chief Complaint:    Chief Complaint   Patient presents with    Hip Pain     Jenna Gibson is a 72 y.o. female who had concerns including Hip Pain.    History of Present Illness:  72 year old female patient presenting to the emergency department today with complaints of left hip pain.  She reports chronic left hip pain.  She denies any new injury to the area.  She denies any recent falls.  She reports pain with movement.  She denies any headache or dizziness.  Denies any back pain, denies any cough or congestion.  Denies any melena or hematochezia.  Denies any loss of bowel or bladder control.  Denies any saddle anesthesia.  Denies any other symptoms.      Hip Pain      Review of Systems:  10 systems reviewed and all systems negative except as stated in history of present illness.    PMH/PSH/FH/SH:    Past Medical History:   Diagnosis Date    Coronary artery disease     Esophageal reflux     Essential hypertension     Hearing loss     Sleep apnea     Thyroid disease        Past Surgical History:   Procedure Laterality Date    Hx hysterectomy      Hx tubal ligation         Family History   Problem Relation Age of Onset    Cancer Other     Sleep apnea Other     Diabetes Other        Social History     Socioeconomic History    Marital status: Widowed     Spouse name: Not on file    Number of children: Not on file    Years of education: Not on file    Highest education level: Not on file   Occupational History    Not on file   Tobacco Use    Smoking status: Never    Smokeless tobacco: Never   Vaping Use    Vaping status: Never Used   Substance and Sexual Activity    Alcohol use: Never    Drug use: Never    Sexual activity: Not on file   Other Topics Concern    Not on file   Social History Narrative    Not on file     Social Determinants of Health     Financial Resource Strain: Not on file   Transportation Needs: Not on file   Social  Connections: Not on file   Intimate Partner Violence: Not on file   Housing Stability: Not on file       Meds/Allergies:    Current Outpatient Medications   Medication Instructions    ARIPiprazole (ABILIFY) 2 mg, Oral, DAILY    atorvastatin (LIPITOR) 10 mg, Oral, DAILY    busPIRone (BUSPAR) 15 mg, Oral, 3 TIMES DAILY    clopidogreL (PLAVIX) 75 mg, Oral, DAILY    cyclobenzaprine (FLEXERIL) 10 mg, Oral, EVERY 8 HOURS PRN    dilTIAZem (CARDIZEM CD) 180 mg, Oral, DAILY    DULoxetine (CYMBALTA DR) 60 mg, Oral, DAILY    famotidine (PEPCID) 40 mg, Oral, EVERY EVENING    furosemide (LASIX) 40 mg, Oral, DAILY    glimepiride (AMARYL) 2 mg, Oral, EVERY MORNING WITH BREAKFAST  Irbesartan-Hydrochlorothiazide 300-12.5 mg Oral Tablet 1 Tablet, Oral, DAILY    levothyroxine (SYNTHROID) 50 mcg, Oral, EVERY MORNING    LORazepam (ATIVAN) 1 mg, Oral, 3 TIMES DAILY    nitroGLYCERIN (NITROSTAT) 0.4 mg, Sublingual, EVERY 5 MIN PRN, for 3 doses over 15 minutes    ondansetron (ZOFRAN) 4 mg, Oral, EVERY 8 HOURS PRN    pantoprazole (PROTONIX) 40 mg, Oral, DAILY    pregabalin (LYRICA) 75 mg, Oral, 2 TIMES DAILY        Allergies   Allergen Reactions    Butorphanol Mental Status Effect    Codeine Rash    Penicillins Rash    Sulfa (Sulfonamides) Rash and Nausea/ Vomiting    Crestor [Rosuvastatin]  Other Adverse Reaction (Add comment)     "Can't remember"    Penciclovir        Physical Exam:    ED Triage Vitals [12/09/22 1853]   BP (Non-Invasive) (!) 161/78   Heart Rate 74   Respiratory Rate 16   Temperature 37.1 C (98.7 F)   SpO2 97 %   Weight 75.3 kg (166 lb)   Height 1.549 m (5\' 1" )     CONSTITUTIONAL: [Alert and oriented and responds appropriately to questions. Well-appearing; well-nourished]  HEAD: [Normocephalic; atraumatic]  EYES: [Conjunctiva non-injected]  ENT: [moist mucous membranes]   NECK: [Supple]  CARD: [RRR]  RESP: [no respiratory distress]  ABD/GI: [non-distended]  BACK: [normal ROM]  EXT: [appear atraumatic]   SKIN: [Normal  color for age and race; warm; dry; good turgor; no acute lesions noted]  NEURO: [Moves all extremities equally]  PSYCH: [The patient's mood and manner are appropriate. Grooming and personal hygiene are appropriate]      Procedure:  Procedures    Results:      Labs:   Labs Ordered/Reviewed - No data to display     Imaging:    XR HIP LEFT W PELVIS 2-3 VIEWS   Final Result by Edi, Radresults In (07/27 1949)   NO ACUTE FRACTURE OR DISLOCATION.       If acute hip fracture is suspected after a fall or minor trauma and initial radiographs are negative then MRI of the pelvis and affected hip without IV contrast or CT of the pelvis and hips without IV contrast is usually appropriate as the next imaging study. (ACR Appropriateness Criteria: Acute Hip Pain-Suspected Fracture, 2018)                Radiologist location ID: [id number]              ED Course:       Medications Administered in the ED   naproxen (NAPROSYN) tablet (500 mg Oral Given 12/09/22 2000)   acetaminophen (TYLENOL) tablet (650 mg Oral Given 12/09/22 2000)               MDM:       Medical Decision Making  72 year old female patient presenting to the emergency department today with complaints of left hip.  She reports chronic left hip pain.  Denies any new injury recent falls.  She denies any chest pain.  On exam she was ambulatory without assistance.  She denies any back pain, denies any pelvic discomfort.  Denies any dysuria, denies loss of bowel or bladder control.  X-ray imaging without acute fracture, does show chronic findings.  We will recommend that she follows up in the outpatient setting.  She was given Tylenol/naproxen in the ED. she was given return precautions ED and verbalized  understanding of these and is in agreement with the treatment plan.  She was discharged in stable condition.    Amount and/or Complexity of Data Reviewed  Radiology: ordered.    Risk  OTC drugs.  Prescription drug management.        Impression:    Clinical Impression   Left  hip pain (Primary)         New Prescriptions    No medications on file      Disposition:   Discharged        Part of this note may have been generated using voice recognition software.  Be advised, it is possible that the generated note may be prone to syntax and other dictation software errors.

## 2022-12-13 ENCOUNTER — Ambulatory Visit (INDEPENDENT_AMBULATORY_CARE_PROVIDER_SITE_OTHER): Payer: Medicare Other

## 2022-12-25 ENCOUNTER — Ambulatory Visit (INDEPENDENT_AMBULATORY_CARE_PROVIDER_SITE_OTHER): Payer: Self-pay | Admitting: NURSE PRACTITIONER

## 2022-12-25 NOTE — Telephone Encounter (Addendum)
Called pt back to assist, no answer, lvm with new date/time of appt.  New appt for 01/30/23 at 200pm. Encouraged to call us if inconvenient.        ----- Message from Vonzell Schlatter sent at 12/25/2022 11:55 AM EDT -----  Cheree Ditto Pt        PT is calling needing to reschedule her appts for tomorrow, if we can call her back please.      Thank you

## 2022-12-26 ENCOUNTER — Ambulatory Visit (INDEPENDENT_AMBULATORY_CARE_PROVIDER_SITE_OTHER): Payer: Self-pay

## 2022-12-26 ENCOUNTER — Ambulatory Visit (INDEPENDENT_AMBULATORY_CARE_PROVIDER_SITE_OTHER): Payer: Self-pay | Admitting: NURSE PRACTITIONER

## 2022-12-27 ENCOUNTER — Emergency Department (HOSPITAL_COMMUNITY): Payer: Medicare Other

## 2022-12-27 ENCOUNTER — Encounter (HOSPITAL_COMMUNITY): Payer: Self-pay | Admitting: Nurse Practitioner

## 2022-12-27 ENCOUNTER — Emergency Department
Admission: EM | Admit: 2022-12-27 | Discharge: 2022-12-28 | Disposition: A | Discharge status: Home / Self Care | Payer: Medicare Other | Attending: Nurse Practitioner | Admitting: Nurse Practitioner

## 2022-12-27 ENCOUNTER — Other Ambulatory Visit: Payer: Self-pay

## 2022-12-27 DIAGNOSIS — G930 Cerebral cysts: Secondary | ICD-10-CM

## 2022-12-27 DIAGNOSIS — N390 Urinary tract infection, site not specified: Secondary | ICD-10-CM | POA: Insufficient documentation

## 2022-12-27 DIAGNOSIS — R531 Weakness: Secondary | ICD-10-CM | POA: Insufficient documentation

## 2022-12-27 DIAGNOSIS — E876 Hypokalemia: Secondary | ICD-10-CM | POA: Insufficient documentation

## 2022-12-27 DIAGNOSIS — Q046 Congenital cerebral cysts: Secondary | ICD-10-CM | POA: Insufficient documentation

## 2022-12-27 DIAGNOSIS — A084 Viral intestinal infection, unspecified: Secondary | ICD-10-CM | POA: Insufficient documentation

## 2022-12-27 DIAGNOSIS — R1030 Lower abdominal pain, unspecified: Secondary | ICD-10-CM | POA: Insufficient documentation

## 2022-12-27 DIAGNOSIS — R519 Headache, unspecified: Secondary | ICD-10-CM | POA: Insufficient documentation

## 2022-12-27 LAB — CBC WITH DIFF
BASOPHIL #: 0.1 10*3/uL (ref 0.00–0.10)
BASOPHIL %: 1 % (ref 0–1)
EOSINOPHIL #: 0.3 10*3/uL (ref 0.00–0.50)
EOSINOPHIL %: 3 % (ref 1–7)
HCT: 33.7 % (ref 31.2–41.9)
HGB: 11.4 g/dL (ref 10.9–14.3)
LYMPHOCYTE #: 2.2 10*3/uL (ref 1.00–3.00)
LYMPHOCYTE %: 23 % (ref 16–44)
MCH: 27.8 pg (ref 24.7–32.8)
MCHC: 33.8 g/dL (ref 32.3–35.6)
MCV: 82.2 fL (ref 75.5–95.3)
MONOCYTE #: 0.7 10*3/uL (ref 0.30–1.00)
MONOCYTE %: 7 % (ref 5–13)
MPV: 7.7 fL — ABNORMAL LOW (ref 7.9–10.8)
NEUTROPHIL #: 6.3 10*3/uL (ref 1.85–7.80)
NEUTROPHIL %: 66 % (ref 43–77)
PLATELETS: 306 10*3/uL (ref 140–440)
RBC: 4.1 10*6/uL (ref 3.63–4.92)
RDW: 21.9 % — ABNORMAL HIGH (ref 12.3–17.7)
WBC: 9.5 10*3/uL (ref 3.8–11.8)

## 2022-12-27 LAB — COMPREHENSIVE METABOLIC PANEL, NON-FASTING
ALBUMIN/GLOBULIN RATIO: 1.4 (ref 0.8–1.4)
ALBUMIN: 4.1 g/dL (ref 3.5–5.7)
ALKALINE PHOSPHATASE: 119 U/L — ABNORMAL HIGH (ref 34–104)
ALT (SGPT): 13 U/L (ref 7–52)
ANION GAP: 15 mmol/L — ABNORMAL HIGH (ref 4–13)
AST (SGOT): 15 U/L (ref 13–39)
BILIRUBIN TOTAL: 0.7 mg/dL (ref 0.3–1.0)
BUN/CREA RATIO: 14 (ref 6–22)
BUN: 19 mg/dL (ref 7–25)
CALCIUM, CORRECTED: 9.4 mg/dL (ref 8.9–10.8)
CALCIUM: 9.5 mg/dL (ref 8.6–10.3)
CHLORIDE: 97 mmol/L — ABNORMAL LOW (ref 98–107)
CO2 TOTAL: 25 mmol/L (ref 21–31)
CREATININE: 1.36 mg/dL — ABNORMAL HIGH (ref 0.60–1.30)
ESTIMATED GFR: 42 mL/min/{1.73_m2} — ABNORMAL LOW (ref >59)
GLOBULIN: 2.9 (ref 2.9–5.4)
GLUCOSE: 122 mg/dL — ABNORMAL HIGH (ref 74–109)
OSMOLALITY, CALCULATED: 277 mOsm/kg (ref 270–290)
POTASSIUM: 3.1 mmol/L — ABNORMAL LOW (ref 3.5–5.1)
PROTEIN TOTAL: 7 g/dL (ref 6.4–8.9)
SODIUM: 137 mmol/L (ref 136–145)

## 2022-12-27 LAB — URINALYSIS, MICROSCOPIC
BACTERIA: NEGATIVE /hpf
HYALINE CASTS: 46 /lpf — ABNORMAL HIGH (ref <0)
RBCS: 2 /hpf (ref <4)
SQUAMOUS EPITHELIAL: 4 /hpf (ref <28)
WBCS: 11 /hpf — ABNORMAL HIGH (ref <6)

## 2022-12-27 LAB — URINALYSIS, MACROSCOPIC
BILIRUBIN: NEGATIVE mg/dL
BLOOD: NEGATIVE mg/dL
GLUCOSE: 30 mg/dL
KETONES: NEGATIVE mg/dL
LEUKOCYTES: 250 WBCs/uL — AB
NITRITE: NEGATIVE
PH: 5 (ref 5.0–9.0)
PROTEIN: NEGATIVE mg/dL
SPECIFIC GRAVITY: 1.016 (ref 1.002–1.030)
UROBILINOGEN: NORMAL mg/dL

## 2022-12-27 LAB — LACTIC ACID LEVEL W/ REFLEX FOR LEVEL >2.0: LACTIC ACID: 1.8 mmol/L (ref 0.5–2.2)

## 2022-12-27 LAB — MAGNESIUM: MAGNESIUM: 1.8 mg/dL — ABNORMAL LOW (ref 1.9–2.7)

## 2022-12-27 LAB — LIPASE: LIPASE: 29 U/L (ref 11–82)

## 2022-12-27 MED ORDER — SODIUM CHLORIDE 0.9 % (FLUSH) INJECTION SYRINGE
3.0000 mL | INJECTION | Freq: Three times a day (TID) | INTRAMUSCULAR | Status: DC
Start: 2022-12-27 — End: 2022-12-28
  Administered 2022-12-27: 3 mL

## 2022-12-27 MED ORDER — POTASSIUM CHLORIDE ER 20 MEQ TABLET,EXTENDED RELEASE(PART/CRYST)
ORAL_TABLET | ORAL | Status: AC
Start: 2022-12-27 — End: 2022-12-27
  Filled 2022-12-27: qty 2

## 2022-12-27 MED ORDER — SODIUM CHLORIDE 0.9 % IV BOLUS
1000.0000 mL | INJECTION | Status: AC
Start: 2022-12-27 — End: 2022-12-27
  Administered 2022-12-27: 1000 mL via INTRAVENOUS
  Administered 2022-12-27: 0 mL via INTRAVENOUS

## 2022-12-27 MED ORDER — POTASSIUM CHLORIDE ER 20 MEQ TABLET,EXTENDED RELEASE(PART/CRYST)
40.0000 meq | ORAL_TABLET | ORAL | Status: AC
Start: 2022-12-27 — End: 2022-12-27
  Administered 2022-12-27: 40 meq via ORAL

## 2022-12-27 MED ORDER — MAGNESIUM SULFATE 1 GRAM/100 ML IN DEXTROSE 5 % INTRAVENOUS PIGGYBACK
1.0000 g | INJECTION | INTRAVENOUS | Status: AC
Start: 2022-12-27 — End: 2022-12-27
  Administered 2022-12-27: 0 g via INTRAVENOUS
  Administered 2022-12-27: 1 g via INTRAVENOUS

## 2022-12-27 MED ORDER — SODIUM CHLORIDE 0.9 % (FLUSH) INJECTION SYRINGE
3.0000 mL | INJECTION | INTRAMUSCULAR | Status: DC | PRN
Start: 2022-12-27 — End: 2022-12-28

## 2022-12-27 MED ORDER — MAGNESIUM SULFATE 1 GRAM/100 ML IN DEXTROSE 5 % INTRAVENOUS PIGGYBACK
INJECTION | INTRAVENOUS | Status: AC
Start: 2022-12-27 — End: 2022-12-27
  Filled 2022-12-27: qty 100

## 2022-12-27 NOTE — ED Triage Notes (Signed)
Diarrhea started yesterday , headache weakness started today .

## 2022-12-27 NOTE — ED Provider Notes (Signed)
[facility]  ED Primary Provider Note  History of Present Illness   Chief Complaint   Patient presents with    Diarrhea     Jenna Gibson is a 72 y.o. female who had concerns including Diarrhea.  Arrival: The patient arrived by Car      This 72 year old female presents to the emergency department complaining of diarrhea and lower abdominal pain that started yesterday and today she woke up with a headache and generalized weakness.  She describes her pain in the abdomen as dull and rates the pain as 6/10.  She also complains of a left-sided headache.  She denies nausea or vomiting.  She denies fever.  She denies coughing.  She denies recent antibiotic treatment.      History provided by:  Patient    History Reviewed This Encounter: Medical History  Surgical History  Family History  Social History    Physical Exam   ED Triage Vitals [12/27/22 1825]   BP (Non-Invasive) (!) 146/60   Heart Rate 79   Respiratory Rate 18   Temperature 36.6 C (97.9 F)   SpO2 97 %   Weight 73.5 kg (162 lb)   Height 1.554 m (5' 1.2")     Physical Exam  Vitals and nursing note reviewed.   Constitutional:       General: She is not in acute distress.     Appearance: Normal appearance. She is obese. She is not ill-appearing, toxic-appearing or diaphoretic.   HENT:      Head: Normocephalic and atraumatic.      Right Ear: External ear normal.      Left Ear: External ear normal.      Nose: Nose normal.      Mouth/Throat:      Mouth: Mucous membranes are moist.      Pharynx: Oropharynx is clear.   Eyes:      Extraocular Movements: Extraocular movements intact.      Conjunctiva/sclera: Conjunctivae normal.      Pupils: Pupils are equal, round, and reactive to light.   Cardiovascular:      Rate and Rhythm: Normal rate and regular rhythm.      Pulses: Normal pulses.      Heart sounds: Normal heart sounds.   Pulmonary:      Effort: Pulmonary effort is normal. No respiratory distress.      Breath sounds: Normal  breath sounds. No stridor. No wheezing, rhonchi or rales.   Chest:      Chest wall: No tenderness.   Abdominal:      General: Abdomen is flat. Bowel sounds are normal. There is no distension.      Palpations: Abdomen is soft. There is no mass.      Tenderness: There is abdominal tenderness (Diffuse lower abdominal tenderness). There is no right CVA tenderness, left CVA tenderness, guarding or rebound.      Hernia: No hernia is present.   Musculoskeletal:         General: No swelling or tenderness. Normal range of motion.      Cervical back: Normal range of motion and neck supple.   Skin:     General: Skin is warm and dry.      Capillary Refill: Capillary refill takes less than 2 seconds.      Coloration: Skin is not jaundiced or pale.   Neurological:      General: No focal deficit present.      Mental Status: She  is alert and oriented to person, place, and time.      Cranial Nerves: No cranial nerve deficit.      Sensory: No sensory deficit.   Psychiatric:         Mood and Affect: Mood normal.         Behavior: Behavior normal.       Patient Data       Labs Ordered/Reviewed   COMPREHENSIVE METABOLIC PANEL, NON-FASTING - Abnormal; Notable for the following components:       Result Value    POTASSIUM 3.1 (*)     CHLORIDE 97 (*)     ANION GAP 15 (*)     CREATININE 1.36 (*)     ESTIMATED GFR 42 (*)     GLUCOSE 122 (*)     ALKALINE PHOSPHATASE 119 (*)     All other components within normal limits    Narrative:     Estimated Glomerular Filtration Rate (eGFR) is calculated using the CKD-EPI (2021) equation, intended for patients 35 years of age and older. If gender is not documented or "unknown", there will be no eGFR calculation.     MAGNESIUM - Abnormal; Notable for the following components:    MAGNESIUM 1.8 (*)     All other components within normal limits   CBC WITH DIFF - Abnormal; Notable for the following components:    RDW 21.9 (*)     MPV 7.7 (*)     All other components within normal limits   URINALYSIS,  MACROSCOPIC - Abnormal; Notable for the following components:    LEUKOCYTES 250 (*)     All other components within normal limits   URINALYSIS, MICROSCOPIC - Abnormal; Notable for the following components:    MUCOUS Rare (*)     WBCS 11 (*)     HYALINE CASTS 46 (*)     All other components within normal limits   LACTIC ACID LEVEL W/ REFLEX FOR LEVEL >2.0 - Normal   LIPASE - Normal   URINE CULTURE,ROUTINE   CBC/DIFF    Narrative:     The following orders were created for panel order CBC/DIFF.  Procedure                               Abnormality         Status                     ---------                               -----------         ------                     CBC WITH ZOXW[[id number]]                Abnormal            Final result                 Please view results for these tests on the individual orders.   URINALYSIS, MACROSCOPIC AND MICROSCOPIC W/CULTURE REFLEX    Narrative:     The following orders were created for panel order URINALYSIS, MACROSCOPIC AND MICROSCOPIC W/CULTURE REFLEX.  Procedure  Abnormality         Status                     ---------                               -----------         ------                     URINALYSIS, MACROSCOPIC[[id number]]      Abnormal            Final result               URINALYSIS, MICROSCOPIC[[id number]]      Abnormal            Final result                 Please view results for these tests on the individual orders.     CT ABDOMEN PELVIS WO IV CONTRAST   Final Result by Edi, Radresults In (08/14 2239)   NO ACUTE FINDINGS AT THE ABDOMEN OR PELVIS ON NONCONTRAST CT.          One or more dose reduction techniques were used (e.g., Automated exposure control, adjustment of the mA and/or kV according to patient size, use of iterative reconstruction technique).         Radiologist location ID: [id number]         CT BRAIN WO IV CONTRAST   Final Result by Edi, Radresults In (08/14 2236)   CHRONIC CHANGES.  NO ACUTE FINDINGS.       5 MM  COLLOID CYST. NEUROSURGICAL CONSULTATION RECOMMENDED IF NOT ALREADY OBTAINED PREVIOUSLY.         One or more dose reduction techniques were used (e.g., Automated exposure control, adjustment of the mA and/or kV according to patient size, use of iterative reconstruction technique).         Radiologist location ID: [id number]           Medical Decision Making          Medical Decision Making  This 72 year old female presents to the emergency department complaining of diarrhea and lower abdominal pain that started yesterday and today she woke up with a headache and generalized weakness.  She describes her pain in the abdomen as dull and rates the pain as 6/10.  She also complains of a left-sided headache.  She denies nausea or vomiting.  She denies fever.  She denies coughing.  She denies recent antibiotic treatment.  Physical exam revealed mild tenderness to the lower abdomen.  Her WBC was 9.5.  Potassium 3.1.  Chloride 97.  Anion gap 15.  Creatinine 1.36.  Estimated GFR 42.  Magnesium 1.8.  Urinalysis indicates urinary tract infection.  Lipase 29.  Lactic acid 1.8.  CT of abdomen pelvis showed no acute abnormality.  CT of the brain showed a colloid cyst measuring 5 mm which was similar size in 2018.  The patient was treated here in the emergency department with normal saline bolus 1000 mL IV, potassium chloride 40 mEq p.o., 1 g of magnesium IV.  She was most likely suffering from viral gastroenteritis and urinary tract infection.  She will be discharged and treated on outpatient basis with cefdinir for urinary tract infection.  She was instructed to follow-up with her primary care provider, nephrologist, and neurosurgeon by calling their office within  the next 24 hours to arrange an appointment.  She was advised to return to the emergency department if worse or as needed.            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 Decision-making details documented in ED Course.     Details: WBC 9.5.  Potassium 3.1.  Chloride 97.  Anion gap 15.  Creatinine 1.36.  Estimated GFR 42.  Magnesium 1.8.  Lipase 29.  Lactic acid 1.8.    Radiology: ordered and independent interpretation performed. Decision-making details documented in ED Course.     Details: CT of abdomen pelvis without IV contrast shows no acute findings.  CT of the brain without IV contrast shows 5 MM COLLOID cyst.  This measures similar size and 2018.  CHRONIC CHANGES.  NO ACUTE FINDINGS.     ECG/medicine tests: independent interpretation performed.    Risk  OTC drugs.  Prescription drug management.      ED Course as of 12/28/22 0027   Wed Dec 27, 2022   2313 WBC: 9.5  Normal   2313 POTASSIUM(!): 3.1  Abnormal   2313 CHLORIDE(!): 97  Abnormal   2313 ANION GAP(!): 15  Abnormal   2313 CREATININE(!): 1.36  Abnormal   2313 ESTIMATED GLOMERULAR FILTRATION RATE(!): 42  Abnormal   2313 MAGNESIUM(!): 1.8  Abnormal   2314 Urinalysis indicates urinary tract infection.   2314 LIPASE: 29  Normal   2314 LACTIC ACID: 1.8  Normal   2315 CT of abdomen pelvis without IV contrast shows no acute findings.   2315 CT of the brain without IV contrast shows:  CHRONIC CHANGES.  NO ACUTE FINDINGS.      5 MM COLLOID CYST.  This measures similar size in 2018.  NEUROSURGICAL CONSULTATION RECOMMENDED IF NOT ALREADY OBTAINED PREVIOUSLY.        2329 Consulted with [facility] concerning the patient's colloid cyst and they advised that they will get  back with [place].   Thu Dec 28, 2022   0022 Consulted with Dr.Petrarca who advised to give the patient a follow-up appointment with a neurosurgeon concerning her colloid cyst.  [facility] called back and gave [place] a referral to a neurosurgeon.   0023 Alert x3 and in no acute distress.  The patient states that she has an appointment with a nephrologist on Friday.  The diagnosis and treatment plan was explained to the patient verbalized understanding of the discharge instructions.            Medications Administered in the ED   NS flush syringe (3 mL Intracatheter Given 12/27/22 2200)   NS flush syringe (has no administration in time range)   acetaminophen (TYLENOL) tablet (has no administration in time range)   NS bolus infusion 1,000 mL (0 mL Intravenous Stopped 12/27/22 2300)   potassium chloride (K-DUR) extended release tablet (40 mEq Oral Given 12/27/22 2306)   magnesium sulfate 1 G in D5W 100 mL premix IVPB (0 g Intravenous Stopped 12/27/22 2336)  Clinical Impression   Viral gastroenteritis (Primary)   Hypokalemia   Hypomagnesemia   Generalized weakness   Nonintractable headache, unspecified chronicity pattern, unspecified headache type   Colloid cyst of brain (CMS HCC)   Lower abdominal pain   Urinary tract infection without hematuria, site unspecified       Disposition: Discharged

## 2022-12-28 DIAGNOSIS — E876 Hypokalemia: Secondary | ICD-10-CM

## 2022-12-28 DIAGNOSIS — R531 Weakness: Secondary | ICD-10-CM

## 2022-12-28 DIAGNOSIS — R519 Headache, unspecified: Secondary | ICD-10-CM

## 2022-12-28 DIAGNOSIS — R1030 Lower abdominal pain, unspecified: Secondary | ICD-10-CM

## 2022-12-28 DIAGNOSIS — A084 Viral intestinal infection, unspecified: Secondary | ICD-10-CM

## 2022-12-28 MED ORDER — ACETAMINOPHEN 325 MG TABLET
ORAL_TABLET | ORAL | Status: AC
Start: 2022-12-28 — End: 2022-12-28
  Filled 2022-12-28: qty 2

## 2022-12-28 MED ORDER — CEFDINIR 300 MG CAPSULE
300.0000 mg | ORAL_CAPSULE | Freq: Two times a day (BID) | ORAL | 0 refills | Status: DC
Start: 2022-12-28 — End: 2022-12-29

## 2022-12-28 MED ORDER — ACETAMINOPHEN 325 MG TABLET
650.0000 mg | ORAL_TABLET | ORAL | Status: AC
Start: 2022-12-28 — End: 2022-12-28
  Administered 2022-12-28: 650 mg via ORAL

## 2022-12-28 NOTE — Discharge Instructions (Addendum)
Thank you for allowing [place] to be part of your care.  Drink plenty of water.  Take antibiotics as directed for your urinary tract infection.  Clear liquid diet for 24-48 hours to help resolve your diarrhea.  You may take over-the-counter Tylenol as needed for headaches.  Follow-up with your primary care provider and the neurosurgeon by calling their office within the next 24 hours to arrange an appointment.  Also follow-up with a nephrologist due to your elevated creatinine and low GFR.  Return to the emergency department if worse or as needed.  We hope you feel better.

## 2022-12-29 ENCOUNTER — Other Ambulatory Visit: Payer: Self-pay

## 2022-12-29 ENCOUNTER — Ambulatory Visit (INDEPENDENT_AMBULATORY_CARE_PROVIDER_SITE_OTHER): Payer: Medicare Other

## 2022-12-29 ENCOUNTER — Ambulatory Visit (INDEPENDENT_AMBULATORY_CARE_PROVIDER_SITE_OTHER): Payer: Self-pay

## 2022-12-29 ENCOUNTER — Other Ambulatory Visit: Payer: Medicare Other

## 2022-12-29 VITALS — BP 135/71 | HR 67 | Ht 61.0 in | Wt 163.1 lb

## 2022-12-29 DIAGNOSIS — E039 Hypothyroidism, unspecified: Secondary | ICD-10-CM | POA: Insufficient documentation

## 2022-12-29 DIAGNOSIS — E559 Vitamin D deficiency, unspecified: Secondary | ICD-10-CM

## 2022-12-29 DIAGNOSIS — N1832 Chronic kidney disease, stage 3b (CMS HCC): Secondary | ICD-10-CM

## 2022-12-29 DIAGNOSIS — E1122 Type 2 diabetes mellitus with diabetic chronic kidney disease: Secondary | ICD-10-CM

## 2022-12-29 DIAGNOSIS — D631 Anemia in chronic kidney disease: Secondary | ICD-10-CM

## 2022-12-29 DIAGNOSIS — I129 Hypertensive chronic kidney disease with stage 1 through stage 4 chronic kidney disease, or unspecified chronic kidney disease: Secondary | ICD-10-CM

## 2022-12-29 DIAGNOSIS — I10 Essential (primary) hypertension: Secondary | ICD-10-CM

## 2022-12-29 DIAGNOSIS — E790 Hyperuricemia without signs of inflammatory arthritis and tophaceous disease: Secondary | ICD-10-CM

## 2022-12-29 DIAGNOSIS — R600 Localized edema: Secondary | ICD-10-CM

## 2022-12-29 DIAGNOSIS — N1831 Chronic kidney disease, stage 3a (CMS HCC): Secondary | ICD-10-CM

## 2022-12-29 DIAGNOSIS — R609 Edema, unspecified: Secondary | ICD-10-CM

## 2022-12-29 LAB — CBC WITH DIFF
BASOPHIL #: 0 10*3/uL (ref 0.00–0.10)
BASOPHIL %: 0 % (ref 0–1)
EOSINOPHIL #: 0.2 10*3/uL (ref 0.00–0.50)
EOSINOPHIL %: 2 % (ref 1–7)
HCT: 34.4 % (ref 31.2–41.9)
HGB: 11.1 g/dL (ref 10.9–14.3)
LYMPHOCYTE #: 1.7 10*3/uL (ref 1.00–3.00)
LYMPHOCYTE %: 14 % — ABNORMAL LOW (ref 16–44)
MCH: 27.1 pg (ref 24.7–32.8)
MCHC: 32.4 g/dL (ref 32.3–35.6)
MCV: 83.6 fL (ref 75.5–95.3)
MONOCYTE #: 0.5 10*3/uL (ref 0.30–1.00)
MONOCYTE %: 5 % (ref 5–13)
MPV: 8.1 fL (ref 7.9–10.8)
NEUTROPHIL #: 9.6 10*3/uL — ABNORMAL HIGH (ref 1.85–7.80)
NEUTROPHIL %: 79 % — ABNORMAL HIGH (ref 43–77)
PLATELETS: 294 10*3/uL (ref 140–440)
RBC: 4.11 10*6/uL (ref 3.63–4.92)
RDW: 21.6 % — ABNORMAL HIGH (ref 12.3–17.7)
WBC: 12.1 10*3/uL — ABNORMAL HIGH (ref 3.8–11.8)

## 2022-12-29 LAB — BASIC METABOLIC PANEL
ANION GAP: 11 mmol/L (ref 4–13)
BUN/CREA RATIO: 15 (ref 6–22)
BUN: 17 mg/dL (ref 7–25)
CALCIUM: 10.1 mg/dL (ref 8.6–10.3)
CHLORIDE: 101 mmol/L (ref 98–107)
CO2 TOTAL: 27 mmol/L (ref 21–31)
CREATININE: 1.13 mg/dL (ref 0.60–1.30)
ESTIMATED GFR: 52 mL/min/{1.73_m2} — ABNORMAL LOW (ref >59)
GLUCOSE: 92 mg/dL (ref 74–109)
OSMOLALITY, CALCULATED: 279 mOsm/kg (ref 270–290)
POTASSIUM: 3.8 mmol/L (ref 3.5–5.1)
SODIUM: 139 mmol/L (ref 136–145)

## 2022-12-29 LAB — VITAMIN D 25 TOTAL: VITAMIN D 25, TOTAL: 24.53 ng/mL — ABNORMAL LOW (ref 30.00–100.00)

## 2022-12-29 LAB — MAGNESIUM: MAGNESIUM: 1.8 mg/dL — ABNORMAL LOW (ref 1.9–2.7)

## 2022-12-29 LAB — PARATHYROID HORMONE (PTH): PTH: 33.9 pg/mL (ref 12.0–88.0)

## 2022-12-29 LAB — URIC ACID: URIC ACID: 8.2 mg/dL — ABNORMAL HIGH (ref 2.3–7.6)

## 2022-12-29 LAB — THYROID STIMULATING HORMONE (SENSITIVE TSH): TSH: 2.734 u[IU]/mL (ref 0.450–5.330)

## 2022-12-29 MED ORDER — CIPROFLOXACIN 250 MG TABLET
250.0000 mg | ORAL_TABLET | Freq: Two times a day (BID) | ORAL | 0 refills | Status: AC
Start: 2022-12-29 — End: 2023-01-05

## 2022-12-29 NOTE — Progress Notes (Addendum)
NEPHROLOGY, [facility]  [address] [place] [address]    Progress Note    Name: Jenna Gibson MRN:  [id number]   Date: 12/29/2022 DOB:  08/12/1950 (72 y.o.)              Nephrology Out  Patient Visit         HPI : 72 y.o. female here to establish care Chronic Kidney Disease stage IIIB and increased edema for one month. Past medical history of Chronic kidney disease, hypothyroidism, hypertension.  Denies history of chronic NSAID use.  Not currently taking NSAIDs.  Denies history of kidney stones.  States her blood sugars under control her last hemoglobin A1c is 7.4.  She is currently taking Lasix 40 mg daily for edema.  Denies shortness of breath and orthopnea.  States her blood pressure is good control running 130s over 70's.  Denies hematuria and dysuria.  Family history:  Denies family history of kidney disease.  Social history:  Denies history of tobacco use, alcohol use, and illicit drug use.   Other Labs:     ROS:     Assessment was negative except what mentioned in in the HPI.     OBJECTIVE:   BP 135/71   Pulse 67   Ht 1.549 m (5\' 1" )   Wt 74 kg (163 lb 2.3 oz)   BMI 30.83 kg/m       General:  NAD, AAOx3  HEENT:  EOMI,  no icterus  NECK: No increased JVD.  Normal inspection   HEART:   Regular rhythm. No murmurs or rubs. No chest wall tenderness   LUNGS: Clear to auscultation bilateral. No wheezes, rales, or rhonchi.   ABDOMEN: +BS, Soft, nontender and nondistended. No rebound or guarding present.   EXTREMITIES:  Bilateral edema.+1   No cyanosis or clubbing.No asterixis    NEURO : Normal speech, EOMI.       LABORATORY DATA:   Lab Results   Component Value Date    BUN 19 12/27/2022    CREATININE 1.36 (H) 12/27/2022    BUNCRRATIO 14 12/27/2022    GFR 42 (L) 12/27/2022    SODIUM 137 12/27/2022    POTASSIUM 3.1 (L) 12/27/2022    CHLORIDE 97 (L) 12/27/2022    CO2 25 12/27/2022    ANIONGAP 15 (H) 12/27/2022    CALCIUM 9.5 12/27/2022    ALBUMIN 4.1 12/27/2022    HGB 11.4 12/27/2022    HCT  33.7 12/27/2022           MEDICATIONS:  Outpatient Medications Marked as Taking for the 12/29/22 encounter (Office Visit) with Cydney Ok, NP   Medication Sig    ARIPiprazole (ABILIFY) 2 mg Oral Tablet Take 1 Tablet (2 mg total) by mouth Once a day    atorvastatin (LIPITOR) 10 mg Oral Tablet Take 1 Tablet (10 mg total) by mouth Once a day    busPIRone (BUSPAR) 15 mg Oral Tablet Take 1 Tablet (15 mg total) by mouth Three times a day    clopidogreL (PLAVIX) 75 mg Oral Tablet Take 1 Tablet (75 mg total) by mouth Once a day    cyclobenzaprine (FLEXERIL) 10 mg Oral Tablet Take 1 Tablet (10 mg total) by mouth Every 8 hours as needed for Muscle spasms    dilTIAZem (CARDIZEM CD) 180 mg Oral Capsule, Sust. Release 24 hr Take 1 Capsule (180 mg total) by mouth Once a day    DULoxetine (CYMBALTA DR) 60 mg Oral Capsule, Delayed  Release(E.C.) Take 1 Capsule (60 mg total) by mouth Once a day    famotidine (PEPCID) 40 mg Oral Tablet Take 1 Tablet (40 mg total) by mouth Every evening    FEROSUL 325 mg (65 mg iron) Oral Tablet     furosemide (LASIX) 40 mg Oral Tablet Take 1 Tablet (40 mg total) by mouth Once a day    Irbesartan-Hydrochlorothiazide 300-12.5 mg Oral Tablet Take 1 Tablet by mouth Once a day    JANUVIA 100 mg Oral Tablet     levothyroxine (SYNTHROID) 50 mcg Oral Tablet Take 1 Tablet (50 mcg total) by mouth Every morning    LORazepam (ATIVAN) 1 mg Oral Tablet Take 1 Tablet (1 mg total) by mouth Three times a day    mupirocin (BACTROBAN) 2 % Ointment     nitroGLYCERIN (NITROSTAT) 0.4 mg Sublingual Tablet, Sublingual Place 1 Tablet (0.4 mg total) under the tongue Every 5 minutes as needed for Chest pain for 3 doses over 15 minutes    ondansetron (ZOFRAN) 4 mg Oral Tablet Take 1 Tablet (4 mg total) by mouth Every 8 hours as needed    pantoprazole (PROTONIX) 40 mg Oral Tablet, Delayed Release (E.C.) Take 1 Tablet (40 mg total) by mouth Once a day    pregabalin (LYRICA) 75 mg Oral Capsule Take 1 Capsule (75 mg total) by  mouth Twice daily     Current Outpatient Medications   Medication Instructions    ARIPiprazole (ABILIFY) 2 mg, Oral, DAILY    atorvastatin (LIPITOR) 10 mg, Oral, DAILY    busPIRone (BUSPAR) 15 mg, Oral, 3 TIMES DAILY    clopidogreL (PLAVIX) 75 mg, Oral, DAILY    cyclobenzaprine (FLEXERIL) 10 mg, Oral, EVERY 8 HOURS PRN    dilTIAZem (CARDIZEM CD) 180 mg, Oral, DAILY    DULoxetine (CYMBALTA DR) 60 mg, Oral, DAILY    famotidine (PEPCID) 40 mg, Oral, EVERY EVENING    FEROSUL 325 mg (65 mg iron) Oral Tablet     furosemide (LASIX) 40 mg, Oral, DAILY    Irbesartan-Hydrochlorothiazide 300-12.5 mg Oral Tablet 1 Tablet, Oral, DAILY    JANUVIA 100 mg Oral Tablet     levothyroxine (SYNTHROID) 50 mcg, Oral, EVERY MORNING    LORazepam (ATIVAN) 1 mg, Oral, 3 TIMES DAILY    mupirocin (BACTROBAN) 2 % Ointment     nitroGLYCERIN (NITROSTAT) 0.4 mg, Sublingual, EVERY 5 MIN PRN, for 3 doses over 15 minutes    ondansetron (ZOFRAN) 4 mg, Oral, EVERY 8 HOURS PRN    pantoprazole (PROTONIX) 40 mg, Oral, DAILY    pregabalin (LYRICA) 75 mg, Oral, 2 TIMES DAILY       ASSESSMENT / PLAN:   ENCOUNTER DIAGNOSES     ICD-10-CM   1. Stage 3b chronic kidney disease (CMS HCC)  N18.32   2. Vitamin D deficiency  E55.9   3. Edema, unspecified type  R60.9   4. Hypertension, unspecified type  I10   5. Hypothyroidism, unspecified type  E03.9          Chronic kidney disease  -Stage IIIa  -Due to diabetes and hypertension  -Creatinine is   -Baseline creatinine 1.36   -obtain Total protein to creatinine ratio:   -CBC and a basic metabolic panel  -Return to clinic in 3 months  -Continue low-sodium diet  -balanced Fluid intake 40-50 oz a day.    -Avoid NSAIDs.  Tylenol only for pain  -Renal imaging:  CT 12/27/2022 kidneys unremarkable, no acute findings.  - Irbesartan/hydrochlorothiazide 300 mg/12.5  mg daily  -Sodium Glucose Cotransporter-2 (SGLT2) Inhibitors:no  -decreased furosemide 20 mg daily      Hypertension  -Blood pressure is acceptable  -Goal less than  130/80  -Continue irbesartan hydrochlorothiazide 300/12.5 mg daily  -Low-salt diet    Diabetes type 2  -A1c goal is less than 7    -Continue Januvia 100 mg daily  -Diabetic diet  -Increase activity and exercise as possible  -Monitor A1C        Edema  -continue Lasix 20 mg daily  -balanced fluid intake 45 oz daily  -monitoring daily weights          Hyperuricemia  -Check uric acid   Lab Results   Component Value Date    URICACID 8.2 (H) 12/29/2022   Monitor uric acid  -low uric acid diet        Anemia of CKD  - Y N   - HB   Lab Results   Component Value Date    HGB 11.1 12/29/2022       - may need epo if HB less than 9.0  -will monitor Iron panel.     I have discussed with the patient the following issues:  The main goal of treatment is to prevent progression of CKD to complete kidney failure. The best way to do this is to control the underlying cause.  the optimal diet is one similar to the Dietary Approaches to Stop Hypertension (DASH) diet, consisting of fruits, vegetables, legumes, fish, poultry, and whole grains.  Restrict sodium intake to less than 2 g/day          Orders Placed This Encounter    CANCELED: BASIC METABOLIC PANEL    CBC/DIFF    MAGNESIUM    MICROALBUMIN/CREATININE RATIO, URINE, RANDOM    PARATHYROID HORMONE (PTH)    PROTEIN/CREATININE RATIO, URINE, RANDOM    URIC ACID    VITAMIN D 25 TOTAL    MONOCLONAL GAMMOPATHY PROFILE WITH SPEP, FLC, AND IMMUNOTYPING REFLEX    BASIC METABOLIC PANEL    CBC/DIFF    MAGNESIUM    MICROALBUMIN/CREATININE RATIO, URINE, RANDOM    PARATHYROID HORMONE (PTH)    PROTEIN/CREATININE RATIO, URINE, RANDOM    URIC ACID    VITAMIN D 25 TOTAL    THYROID STIMULATING HORMONE (SENSITIVE TSH)    COMPREHENSIVE METABOLIC PANEL, NON-FASTING    ciprofloxacin HCl (CIPRO) 250 mg Oral Tablet         Cydney Ok, NP

## 2022-12-30 LAB — URINE CULTURE,ROUTINE: URINE CULTURE: >100000 — AB

## 2022-12-30 LAB — ALBUMIN FOR ELECTROPHORESIS: ALBUMIN: 4.1 g/dL (ref 3.4–4.8)

## 2022-12-30 LAB — PROTEIN FOR ELECTROPHORESIS: PROTEIN TOTAL: 7.4 g/dL (ref 5.6–7.6)

## 2023-01-01 LAB — MONOCLONAL GAMMOPATHY PROFILE WITH IMMUNOTYPING REFLEX
ALBUMIN: 4.1 g/dL (ref 3.4–4.8)
KAPPA FREE LIGHT CHAINS: 2.35 mg/dL (ref 1.25–3.25)
KAPPA/LAMBDA FLC RATIO: 1.21 (ref 0.80–2.10)
LAMBDA FREE LIGHT CHAINS: 1.95 mg/dL (ref 0.60–2.70)
TOTAL PROTEIN: 7.4 g/dL

## 2023-01-01 LAB — KAPPA AND LAMBDA FREE LIGHT CHAINS, SERUM
KAPPA FREE LIGHT CHAINS: 2.35 mg/dL (ref 1.25–3.25)
KAPPA/LAMBDA FLC RATIO: 1.21 (ref 0.80–2.10)
LAMBDA FREE LIGHT CHAINS: 1.95 mg/dL (ref 0.60–2.70)

## 2023-01-02 ENCOUNTER — Other Ambulatory Visit (INDEPENDENT_AMBULATORY_CARE_PROVIDER_SITE_OTHER): Payer: Self-pay | Admitting: Nephrology

## 2023-01-02 DIAGNOSIS — R609 Edema, unspecified: Secondary | ICD-10-CM

## 2023-01-02 DIAGNOSIS — N1832 Chronic kidney disease, stage 3b (CMS HCC): Secondary | ICD-10-CM

## 2023-01-03 ENCOUNTER — Ambulatory Visit (INDEPENDENT_AMBULATORY_CARE_PROVIDER_SITE_OTHER): Payer: Medicare Other | Admitting: Nephrology

## 2023-01-03 ENCOUNTER — Other Ambulatory Visit: Payer: Self-pay

## 2023-01-03 ENCOUNTER — Encounter (INDEPENDENT_AMBULATORY_CARE_PROVIDER_SITE_OTHER): Payer: Self-pay | Admitting: Nephrology

## 2023-01-03 ENCOUNTER — Other Ambulatory Visit: Payer: Medicare Other | Attending: Nephrology | Admitting: Nephrology

## 2023-01-03 ENCOUNTER — Ambulatory Visit (INDEPENDENT_AMBULATORY_CARE_PROVIDER_SITE_OTHER): Payer: Medicare Other

## 2023-01-03 VITALS — BP 113/63 | HR 61 | Ht 61.0 in | Wt 163.1 lb

## 2023-01-03 DIAGNOSIS — E559 Vitamin D deficiency, unspecified: Secondary | ICD-10-CM

## 2023-01-03 DIAGNOSIS — E790 Hyperuricemia without signs of inflammatory arthritis and tophaceous disease: Secondary | ICD-10-CM

## 2023-01-03 DIAGNOSIS — R609 Edema, unspecified: Secondary | ICD-10-CM | POA: Insufficient documentation

## 2023-01-03 DIAGNOSIS — I129 Hypertensive chronic kidney disease with stage 1 through stage 4 chronic kidney disease, or unspecified chronic kidney disease: Secondary | ICD-10-CM

## 2023-01-03 DIAGNOSIS — E1122 Type 2 diabetes mellitus with diabetic chronic kidney disease: Secondary | ICD-10-CM

## 2023-01-03 DIAGNOSIS — N1832 Chronic kidney disease, stage 3b (CMS HCC): Secondary | ICD-10-CM

## 2023-01-03 DIAGNOSIS — N1831 Chronic kidney disease, stage 3a (CMS HCC): Secondary | ICD-10-CM

## 2023-01-03 DIAGNOSIS — R600 Localized edema: Secondary | ICD-10-CM

## 2023-01-03 LAB — PROTEIN/CREATININE RATIO, URINE, RANDOM
CREATININE RANDOM URINE: 90 mg/dL (ref 30–125)
PROTEIN RANDOM URINE: 6 mg/dL — ABNORMAL LOW (ref 50–80)
PROTEIN/CREATININE RATIO RANDOM URINE: 0.067 mg/mg (ref 0.000–200.000)

## 2023-01-03 LAB — MICROALBUMIN/CREATININE RATIO, URINE, RANDOM
CREATININE RANDOM URINE: 90 mg/dL (ref 30–125)
MICROALBUMIN RANDOM URINE: <0.7 mg/dL

## 2023-01-03 MED ORDER — FUROSEMIDE 20 MG TABLET
40.0000 mg | ORAL_TABLET | Freq: Two times a day (BID) | ORAL | 3 refills | Status: AC
Start: 2023-01-03 — End: 2024-01-03

## 2023-01-03 NOTE — Progress Notes (Signed)
NEPHROLOGY, [facility]  [address] [place] [address]    Progress Note    Name: Jenna Gibson MRN:  [id number]   Date: 01/03/2023 DOB:  02-20-1951 (72 y.o.)              Nephrology Out  Patient Visit         HPI : 72 y.o. female here to establish care Chronic Kidney Disease stage IIIB and increased edema for two months. Past medical history of Chronic kidney disease, hypothyroidism, hypertension. Recently diagnosed with a Colloid cyst: is following Neurology [facility].  Denies history of chronic NSAID use.  Not currently taking NSAIDs.  Denies history of kidney stones.   Patient remains to have edema.  No nausea no vomiting.  No diarrhea.  Patient consume high sodium diet.  Family history:  Denies family history of kidney disease.  Social history:  Denies history of tobacco use, alcohol use, and illicit drug use.       ROS:     Assessment was negative except what mentioned in in the HPI.     OBJECTIVE:   BP 113/63   Pulse 61   Ht 1.549 m (5\' 1" )   Wt 74 kg (163 lb 2.3 oz)   BMI 30.83 kg/m       General:  NAD, AAOx3  HEENT:  EOMI,  no icterus  NECK: No increased JVD.  Normal inspection   HEART:   Regular rhythm. No murmurs or rubs. No chest wall tenderness   LUNGS: Clear to auscultation bilateral. No wheezes, rales, or rhonchi.   ABDOMEN: +BS, Soft, nontender and nondistended. No rebound or guarding present.   EXTREMITIES:  Bilateral edema.+1   No cyanosis or clubbing.No asterixis    NEURO : Normal speech, EOMI.       LABORATORY DATA:   Lab Results   Component Value Date    BUN 17 12/29/2022    BUN 19 12/27/2022    CREATININE 1.13 12/29/2022    CREATININE 1.36 (H) 12/27/2022    BUNCRRATIO 15 12/29/2022    BUNCRRATIO 14 12/27/2022    GFR 52 (L) 12/29/2022    GFR 42 (L) 12/27/2022    SODIUM 139 12/29/2022    SODIUM 137 12/27/2022    POTASSIUM 3.8 12/29/2022    POTASSIUM 3.1 (L) 12/27/2022    CHLORIDE 101 12/29/2022    CHLORIDE 97 (L) 12/27/2022    CO2 27 12/29/2022    CO2 25 12/27/2022     ANIONGAP 11 12/29/2022    ANIONGAP 15 (H) 12/27/2022    CALCIUM 10.1 12/29/2022    CALCIUM 9.5 12/27/2022    ALBUMIN 4.1 12/29/2022    ALBUMIN 4.1 12/27/2022    HGB 11.1 12/29/2022    HGB 11.4 12/27/2022    HCT 34.4 12/29/2022    HCT 33.7 12/27/2022    INTACTPTH 33.9 12/29/2022           MEDICATIONS:  Outpatient Medications Marked as Taking for the 01/03/23 encounter (Office Visit) with Rhina Brackett, MD   Medication Sig    ARIPiprazole (ABILIFY) 2 mg Oral Tablet Take 1 Tablet (2 mg total) by mouth Once a day    atorvastatin (LIPITOR) 10 mg Oral Tablet Take 1 Tablet (10 mg total) by mouth Once a day    busPIRone (BUSPAR) 15 mg Oral Tablet Take 1 Tablet (15 mg total) by mouth Three times a day    ciprofloxacin HCl (CIPRO) 250 mg Oral Tablet Take 1 Tablet (250 mg  total) by mouth Twice daily for 7 days    clopidogreL (PLAVIX) 75 mg Oral Tablet Take 1 Tablet (75 mg total) by mouth Once a day    cyclobenzaprine (FLEXERIL) 10 mg Oral Tablet Take 1 Tablet (10 mg total) by mouth Every 8 hours as needed for Muscle spasms    dilTIAZem (CARDIZEM CD) 180 mg Oral Capsule, Sust. Release 24 hr Take 1 Capsule (180 mg total) by mouth Once a day    DULoxetine (CYMBALTA DR) 60 mg Oral Capsule, Delayed Release(E.C.) Take 1 Capsule (60 mg total) by mouth Once a day    famotidine (PEPCID) 40 mg Oral Tablet Take 1 Tablet (40 mg total) by mouth Every evening    FEROSUL 325 mg (65 mg iron) Oral Tablet     furosemide (LASIX) 40 mg Oral Tablet Take 1 Tablet (40 mg total) by mouth Once a day    Irbesartan-Hydrochlorothiazide 300-12.5 mg Oral Tablet Take 1 Tablet by mouth Once a day    JANUVIA 100 mg Oral Tablet     levothyroxine (SYNTHROID) 50 mcg Oral Tablet Take 1 Tablet (50 mcg total) by mouth Every morning    LORazepam (ATIVAN) 1 mg Oral Tablet Take 1 Tablet (1 mg total) by mouth Three times a day    pantoprazole (PROTONIX) 40 mg Oral Tablet, Delayed Release (E.C.) Take 1 Tablet (40 mg total) by mouth Once a day    pregabalin (LYRICA) 75 mg  Oral Capsule Take 1 Capsule (75 mg total) by mouth Twice daily     Current Outpatient Medications   Medication Instructions    ARIPiprazole (ABILIFY) 2 mg, Oral, DAILY    atorvastatin (LIPITOR) 10 mg, Oral, DAILY    busPIRone (BUSPAR) 15 mg, Oral, 3 TIMES DAILY    ciprofloxacin HCl (CIPRO) 250 mg, Oral, 2 TIMES DAILY    clopidogreL (PLAVIX) 75 mg, Oral, DAILY    cyclobenzaprine (FLEXERIL) 10 mg, Oral, EVERY 8 HOURS PRN    dilTIAZem (CARDIZEM CD) 180 mg, Oral, DAILY    DULoxetine (CYMBALTA DR) 60 mg, Oral, DAILY    famotidine (PEPCID) 40 mg, Oral, EVERY EVENING    FEROSUL 325 mg (65 mg iron) Oral Tablet     furosemide (LASIX) 40 mg, Oral, DAILY    Irbesartan-Hydrochlorothiazide 300-12.5 mg Oral Tablet 1 Tablet, Oral, DAILY    JANUVIA 100 mg Oral Tablet     levothyroxine (SYNTHROID) 50 mcg, Oral, EVERY MORNING    LORazepam (ATIVAN) 1 mg, Oral, 3 TIMES DAILY    mupirocin (BACTROBAN) 2 % Ointment     nitroGLYCERIN (NITROSTAT) 0.4 mg, Sublingual, EVERY 5 MIN PRN, for 3 doses over 15 minutes    ondansetron (ZOFRAN) 4 mg, Oral, EVERY 8 HOURS PRN    pantoprazole (PROTONIX) 40 mg, Oral, DAILY    pregabalin (LYRICA) 75 mg, Oral, 2 TIMES DAILY       ASSESSMENT / PLAN:   ENCOUNTER DIAGNOSES     ICD-10-CM   1. Stage 3b chronic kidney disease (CMS HCC)  N18.32   2. Vitamin D deficiency  E55.9          Chronic kidney disease  -Stage IIIa  -Due to diabetes and hypertension  -Creatinine is 1.13/GFR 52, condition is stable  -Baseline creatinine 1.36   -obtain Total protein to creatinine ratio:   -CBC and a basic metabolic panel  -Return to clinic in 3 months  -Continue low-sodium diet  -balanced Fluid intake 40-50 oz a day.    -Avoid NSAIDs.  Tylenol only for  pain  -Renal imaging:  CT 12/27/2022 kidneys unremarkable, no acute findings.  - Irbesartan/hydrochlorothiazide 300 mg/12.5 mg daily  -Sodium Glucose Cotransporter-2 (SGLT2) Inhibitors:no, depending on the urine test may add  -low-sodium diet   -Lasix 40 in the morning and 20 in  the evening.  Continue HCTZ      Hypertension  -Blood pressure is acceptable  -Goal less than 130/80  -Continue irbesartan hydrochlorothiazide 300/12.5 mg daily  -Low-salt diet      Diabetes type 2  -A1c goal is less than 7    -Continue Januvia 100 mg daily  -Diabetic diet  -Increase activity and exercise as possible  -Monitor A1C        Edema  -due to high sodium diet and chronic kidney disease.  -Lasix HCTZ and low-sodium diet  -TSH he has normal  -monoclonal gammopathy profile, with a normal no pathological findings      Hyperuricemia  -Check uric acid   Lab Results   Component Value Date    URICACID 8.2 (H) 12/29/2022   Monitor uric acid  -low uric acid diet  -may start allopurinol next visit            Orders Placed This Encounter    BASIC METABOLIC PANEL    CBC/DIFF    MAGNESIUM    MICROALBUMIN/CREATININE RATIO, URINE, RANDOM    PARATHYROID HORMONE (PTH)    PROTEIN/CREATININE RATIO, URINE, RANDOM    URIC ACID    VITAMIN D 25 TOTAL

## 2023-01-10 ENCOUNTER — Telehealth (HOSPITAL_PSYCHIATRIC): Payer: Self-pay | Admitting: PHYSICIAN ASSISTANT

## 2023-01-10 NOTE — Telephone Encounter (Signed)
Needs refills on Ativan            [employer]      [phone]

## 2023-01-16 ENCOUNTER — Ambulatory Visit: Payer: Medicare Other | Attending: PHYSICIAN ASSISTANT | Admitting: PHYSICIAN ASSISTANT

## 2023-01-16 ENCOUNTER — Other Ambulatory Visit: Payer: Self-pay

## 2023-01-16 ENCOUNTER — Encounter (HOSPITAL_PSYCHIATRIC): Payer: Self-pay | Admitting: PHYSICIAN ASSISTANT

## 2023-01-16 VITALS — BP 142/78 | HR 74 | Resp 18 | Ht 61.0 in | Wt 163.0 lb

## 2023-01-16 DIAGNOSIS — F331 Major depressive disorder, recurrent, moderate: Secondary | ICD-10-CM | POA: Insufficient documentation

## 2023-01-16 DIAGNOSIS — F411 Generalized anxiety disorder: Secondary | ICD-10-CM | POA: Insufficient documentation

## 2023-01-16 MED ORDER — BUSPIRONE 15 MG TABLET
15.0000 mg | ORAL_TABLET | Freq: Two times a day (BID) | ORAL | 1 refills | Status: AC
Start: 2023-01-16 — End: ?

## 2023-01-16 MED ORDER — ARIPIPRAZOLE 2 MG TABLET
2.0000 mg | ORAL_TABLET | Freq: Every day | ORAL | 1 refills | Status: AC
Start: 2023-01-16 — End: ?

## 2023-01-16 MED ORDER — DULOXETINE 60 MG CAPSULE,DELAYED RELEASE
60.0000 mg | DELAYED_RELEASE_CAPSULE | Freq: Every day | ORAL | 1 refills | Status: AC
Start: 2023-01-16 — End: ?

## 2023-01-16 MED ORDER — LORAZEPAM 1 MG TABLET
1.0000 mg | ORAL_TABLET | Freq: Three times a day (TID) | ORAL | 5 refills | Status: AC
Start: 2023-01-16 — End: ?

## 2023-01-16 NOTE — Patient Instructions (Signed)
Take medications as prescribed, avoid drugs and alcohol, call office if symptoms worsen or problems arise.  [phone].

## 2023-01-16 NOTE — Progress Notes (Signed)
BEHAVIORAL MEDICINE, [facility]  [address]  [place] [address]  Operated by [facility]  Progress Note    Name: Jenna Gibson MRN:  [id number]   Date: 01/16/2023 DOB:  08/07/1950 (72 y.o.)           Chief Complaint: Major Depression and Generalized Anxiety    Subjective:   Patient reports that she is going to see her grandson at the end of the month to see her great-grand-baby that was born in November of last year. They are getting married at the end of September. They live in [place], [place], about a 10 hour drive. They are thinking of flying this year.   Mood: "okay"  Medications:  Okay, but she has been out of Cymbalta for awhile.   Appetite: Eating well with a good appetite.   Sleep: Doing pretty good. She has been on Lasix, which has her up every two hours to use the restroom. She denies nightmares or difficulties.   Energy: Not much, her back has been messed up for the past couple of months. If she is hurting really bad, she has difficulty walking. She has tried the patches, she has been taking Tylenol Arthritis. She was supposed to see her PCP on August 28th after having water difficulties and she has not been re-scheduled.  Stressors: Her son wants her to travel to [place] and see his children, who are in their teens and twenties. The youngest is the football star and he wants her to see him play.     Patient Active Problem List   Diagnosis    Recurrent major depressive disorder (CMS HCC)    GAD (generalized anxiety disorder)    Colloid cyst of brain (CMS HCC)    Nonintractable headache    Generalized weakness    Hypomagnesemia    Hypokalemia    Viral gastroenteritis    Lower abdominal pain     Past Medical History:   Diagnosis Date    Coronary artery disease     Esophageal reflux     Essential hypertension     Hearing loss     Sleep apnea     Thyroid disease      Past Surgical History:   Procedure Laterality Date    HX HYSTERECTOMY      HX  TUBAL LIGATION       Family History   Problem Relation Name Age of Onset    Cancer Other      Sleep apnea Other      Diabetes Other       Social History     Socioeconomic History    Marital status: Widowed   Tobacco Use    Smoking status: Never    Smokeless tobacco: Never   Vaping Use    Vaping status: Never Used   Substance and Sexual Activity    Alcohol use: Never    Drug use: Never      Butorphanol, Codeine, Penicillins, Sulfa (sulfonamides), Crestor [rosuvastatin], and Penciclovir   Current Outpatient Medications   Medication Sig    ARIPiprazole (ABILIFY) 2 mg Oral Tablet Take 1 Tablet (2 mg total) by mouth Once a day    atorvastatin (LIPITOR) 10 mg Oral Tablet Take 1 Tablet (10 mg total) by mouth Once a day    busPIRone (BUSPAR) 15 mg Oral Tablet Take 1 Tablet (15 mg total) by mouth Three times a day (Patient taking differently: Take 1 Tablet (15 mg  total) by mouth Three times a day Patient takes twice per day)    clopidogreL (PLAVIX) 75 mg Oral Tablet Take 1 Tablet (75 mg total) by mouth Once a day    cyclobenzaprine (FLEXERIL) 10 mg Oral Tablet Take 1 Tablet (10 mg total) by mouth Every 8 hours as needed for Muscle spasms    dilTIAZem (CARDIZEM CD) 180 mg Oral Capsule, Sust. Release 24 hr Take 1 Capsule (180 mg total) by mouth Once a day    DULoxetine (CYMBALTA DR) 60 mg Oral Capsule, Delayed Release(E.C.) Take 1 Capsule (60 mg total) by mouth Once a day (Patient not taking: Reported on 01/16/2023)    famotidine (PEPCID) 40 mg Oral Tablet Take 1 Tablet (40 mg total) by mouth Every evening    FEROSUL 325 mg (65 mg iron) Oral Tablet     furosemide (LASIX) 20 mg Oral Tablet Take 2 Tablets (40 mg total) by mouth Twice daily Take 2 tablets in the morning and 1 tablet in the evening    Irbesartan-Hydrochlorothiazide 300-12.5 mg Oral Tablet Take 1 Tablet by mouth Once a day    JANUVIA 100 mg Oral Tablet     levothyroxine (SYNTHROID) 50 mcg Oral Tablet Take 1 Tablet (50 mcg total) by mouth Every morning    LORazepam  (ATIVAN) 1 mg Oral Tablet Take 1 Tablet (1 mg total) by mouth Three times a day    mupirocin (BACTROBAN) 2 % Ointment     nitroGLYCERIN (NITROSTAT) 0.4 mg Sublingual Tablet, Sublingual Place 1 Tablet (0.4 mg total) under the tongue Every 5 minutes as needed for Chest pain for 3 doses over 15 minutes    ondansetron (ZOFRAN) 4 mg Oral Tablet Take 1 Tablet (4 mg total) by mouth Every 8 hours as needed    pantoprazole (PROTONIX) 40 mg Oral Tablet, Delayed Release (E.C.) Take 1 Tablet (40 mg total) by mouth Once a day    pregabalin (LYRICA) 75 mg Oral Capsule Take 1 Capsule (75 mg total) by mouth Twice daily      Objective :  BP (!) 142/78 (Site: Left Arm, Patient Position: Sitting)   Pulse 74   Resp 18   Ht 1.549 m (5\' 1" )   Wt 73.9 kg (163 lb)   BMI 30.80 kg/m       PHQ Total Score  PHQ 2 Total: 3  PHQ 9 Total: 8  Interpretation of Total Score: 5-9 Mild depression             06/30/2022    11:52 AM 10/16/2022     2:00 PM 01/16/2023     1:13 PM   Most Recent PHQ-9 Scores   PHQ 9 Total 9 8 8         Mental Status Exam  AXOX4. Casual dress, calm, well-groomed. No SI, HI, AVH, delusions, or paranoia. Thoughts are logical, coherent, and goal-directed. Good eye contact. Speech is normal in rate and tone. Mood is "okay" affect congruent. No psychomotor agitation or retardation, cogwheel rigidity, or abnormal movements. Gait is normal. Attention, concentration, and memory are good. No cognitive deficits noted. Judgment and insight are fair. Calculation and abstraction are within normal limits.     Data Reviewed  I have reviewed patient's previous note medical, surgical, family, and social history in detail today,     Assessment  Major Depression and Generalized Anxiety    Plan  Continue the following medications:  Abilify 2 mg daily for mood.  Decrease from Buspar 15 mg tid to bid for  mood.  Re-start Cymbalta 60 mg daily for mood.  Ativan 1 mg tid for anxiety.      Follow up  Return to clinic in 6 months.    Maren Beach, PA-C

## 2023-01-26 ENCOUNTER — Encounter (INDEPENDENT_AMBULATORY_CARE_PROVIDER_SITE_OTHER): Payer: Self-pay | Admitting: Physician Assistant

## 2023-01-26 ENCOUNTER — Ambulatory Visit (INDEPENDENT_AMBULATORY_CARE_PROVIDER_SITE_OTHER): Payer: Medicare Other | Admitting: Physician Assistant

## 2023-01-26 ENCOUNTER — Other Ambulatory Visit: Payer: Self-pay

## 2023-01-26 DIAGNOSIS — N3281 Overactive bladder: Secondary | ICD-10-CM

## 2023-01-26 DIAGNOSIS — N393 Stress incontinence (female) (male): Secondary | ICD-10-CM

## 2023-01-26 DIAGNOSIS — N3946 Mixed incontinence: Secondary | ICD-10-CM

## 2023-01-26 DIAGNOSIS — N3941 Urge incontinence: Secondary | ICD-10-CM

## 2023-01-26 MED ORDER — SOLIFENACIN 10 MG TABLET
10.0000 mg | ORAL_TABLET | Freq: Every day | ORAL | 3 refills | Status: AC
Start: 2023-01-26 — End: 2024-01-26

## 2023-01-26 NOTE — Progress Notes (Signed)
UROLOGY, [facility]  [address] [address]    Progress Note    Name: Jenna Gibson MRN:  [id number]   Date: 01/26/2023 DOB:  Oct 02, 1950 (72 y.o.)             Chief Complaint: Follow Up  Subjective   Subjective:   Jenna Gibson is a pleasant 72 y.o. White female whom presents to the clinic for urinary incontinence. Patient with UTI last month; asymptomatic.Patient with urinary urgency and incontinence both with and without sense of urgency; urgency most bothersome. Patient uses 2 pads daily.  Patient reports urinary leaking with laugh, cough, sneeze as well. Patient also reports enuresis with 2 pads nightly. Patient denies fevers, chills, nausea, vomiting, hematuria, dysuria, flank pain,dribbling, hesitancy, suprapubic pain, headaches, vision changes, shortness of breath, chest pain.Patient denies personal or family history of urinary malignancy.Patient denies occupational chemical exposure.Patient denies history of tobacco use.Ct abdomen/pelvis w/o con 12/28/22 without abnormalities of kidneys; no hydronephrosis or calculi.         Objective   Objective :  There were no vitals taken for this visit.      Gen: NAD, alert  Pulm: unlabored at rest  CV: palpable pulses  Abd: soft, Nt/ND  GU: no suprapubic tenderness, no CVAT    Data reviewed:    Current Outpatient Medications   Medication Sig    ARIPiprazole (ABILIFY) 2 mg Oral Tablet Take 1 Tablet (2 mg total) by mouth Once a day    atorvastatin (LIPITOR) 10 mg Oral Tablet Take 1 Tablet (10 mg total) by mouth Once a day    busPIRone (BUSPAR) 15 mg Oral Tablet Take 1 Tablet (15 mg total) by mouth Twice daily    clopidogreL (PLAVIX) 75 mg Oral Tablet Take 1 Tablet (75 mg total) by mouth Once a day    clotrimazole-betamethasone (LOTRISONE) 1-0.05 % Cream     cyclobenzaprine (FLEXERIL) 10 mg Oral Tablet Take 1 Tablet (10 mg total) by mouth Every 8 hours as needed for Muscle spasms    dilTIAZem (CARDIZEM CD) 180 mg Oral Capsule, Sust.  Release 24 hr Take 1 Capsule (180 mg total) by mouth Once a day    DULoxetine (CYMBALTA DR) 60 mg Oral Capsule, Delayed Release(E.C.) Take 1 Capsule (60 mg total) by mouth Once a day    famotidine (PEPCID) 40 mg Oral Tablet Take 1 Tablet (40 mg total) by mouth Every evening    FEROSUL 325 mg (65 mg iron) Oral Tablet     furosemide (LASIX) 20 mg Oral Tablet Take 2 Tablets (40 mg total) by mouth Twice daily Take 2 tablets in the morning and 1 tablet in the evening    Irbesartan-Hydrochlorothiazide 300-12.5 mg Oral Tablet Take 1 Tablet by mouth Once a day    JANUVIA 100 mg Oral Tablet     levothyroxine (SYNTHROID) 50 mcg Oral Tablet Take 1 Tablet (50 mcg total) by mouth Every morning    LORazepam (ATIVAN) 1 mg Oral Tablet Take 1 Tablet (1 mg total) by mouth Three times a day    mupirocin (BACTROBAN) 2 % Ointment     nitroGLYCERIN (NITROSTAT) 0.4 mg Sublingual Tablet, Sublingual Place 1 Tablet (0.4 mg total) under the tongue Every 5 minutes as needed for Chest pain for 3 doses over 15 minutes    ondansetron (ZOFRAN) 4 mg Oral Tablet Take 1 Tablet (4 mg total) by mouth Every 8 hours as needed    pantoprazole (PROTONIX) 40 mg Oral  Tablet, Delayed Release (E.C.) Take 1 Tablet (40 mg total) by mouth Once a day    pregabalin (LYRICA) 75 mg Oral Capsule Take 1 Capsule (75 mg total) by mouth Twice daily    solifenacin (VESICARE) 10 mg Oral Tablet Take 1 Tablet (10 mg total) by mouth Once a day        Assessment/Plan  Problem List Items Addressed This Visit    None  Visit Diagnoses       Urgency incontinence    -  Primary          OAB  I discussed the differential diagnosis, pathophysiology and nature of patient's overactive bladder/urge urinary incontinence  I also counseled patient on conservative management options including appropriate fluid management, avoidance of diuretics including caffeine and alcohol, weight loss (if applicable), dedicated pelvic floor muscle therapy and Kegel's exercises  Additionally, we discussed  the role of pharmacotherapy, including risks, benefits and alternatives:  Anticholinergic therapy (e.g. Oxybutynin, Tolterodine, Solifenacin, etc)- discussed potential risks of dry mouth, dry eyes, constipation, impaired cognition, prolonged Q-T interval and urinary retention  Beta-3 agonist therapy (e.g. Mirabegron) - discussed potential risks of hypertension, nasopharyngitis, urinary tract infection and headache  Prescription Solifenacin succinate (VESIcareT) 10 mg P.O. daily:    I have discussed in great detail with the patient the treatment of urge incontinence/overactive bladder symptoms using solifenacin.  I have explained my rationale for using solifenacin as well as potential dose-dependent risks of dry mouth (10.9-27.6%), constipation (5.4-13.4%), and blurry vision (3.8-4.8%).  Patient was also cautioned that full therapeutic efficacy may take up to twelve weeks.  Patient expressed an understanding of the treatment, possible reactions, and possible prognosis.    SUI  We discussed in detail the nature and pathophysiology of bothersome stress urinary incontinence.  Given the bothersome nature of her symptoms, we discussed potential treatment options including:  Conservative management, including pelvic floor "Kegel" exercises, which is generally reserved for mild cases  Estrogen replacement therapy either primarily or as an adjunct, when not contraindicated, to restore blood flow to the female pelvic organs to aid in preserving vascularity to her pelvic floor  Urethral bulking agents  Synthetic midurethral sling delivered either a retropubic or transobturator approach  Bladder neck sling utilizing autologous fascia  We also discussed the incidence, nature and risks of de novo urge urinary incontinence following treatment of stress urinary incontinence, particularly following midurethral sling      Sreeja Spies, PA-C

## 2023-01-30 ENCOUNTER — Ambulatory Visit (INDEPENDENT_AMBULATORY_CARE_PROVIDER_SITE_OTHER): Payer: Self-pay

## 2023-01-30 ENCOUNTER — Ambulatory Visit (INDEPENDENT_AMBULATORY_CARE_PROVIDER_SITE_OTHER): Payer: Self-pay | Admitting: NURSE PRACTITIONER

## 2023-02-20 ENCOUNTER — Other Ambulatory Visit (HOSPITAL_COMMUNITY): Payer: Self-pay | Admitting: NURSE PRACTITIONER

## 2023-02-20 DIAGNOSIS — Z1231 Encounter for screening mammogram for malignant neoplasm of breast: Secondary | ICD-10-CM

## 2023-03-07 ENCOUNTER — Ambulatory Visit (HOSPITAL_COMMUNITY): Payer: Self-pay

## 2023-03-27 ENCOUNTER — Other Ambulatory Visit (INDEPENDENT_AMBULATORY_CARE_PROVIDER_SITE_OTHER): Payer: Medicare Other

## 2023-04-03 ENCOUNTER — Other Ambulatory Visit (INDEPENDENT_AMBULATORY_CARE_PROVIDER_SITE_OTHER): Payer: Self-pay

## 2023-04-03 ENCOUNTER — Encounter (INDEPENDENT_AMBULATORY_CARE_PROVIDER_SITE_OTHER): Payer: Self-pay

## 2023-04-05 ENCOUNTER — Encounter (INDEPENDENT_AMBULATORY_CARE_PROVIDER_SITE_OTHER): Payer: Self-pay

## 2023-04-27 ENCOUNTER — Encounter (INDEPENDENT_AMBULATORY_CARE_PROVIDER_SITE_OTHER): Payer: Medicare Other | Admitting: Physician Assistant

## 2023-05-14 ENCOUNTER — Other Ambulatory Visit (INDEPENDENT_AMBULATORY_CARE_PROVIDER_SITE_OTHER): Payer: Self-pay

## 2023-05-18 ENCOUNTER — Telehealth (INDEPENDENT_AMBULATORY_CARE_PROVIDER_SITE_OTHER): Payer: Self-pay | Admitting: Nephrology

## 2023-05-18 NOTE — Telephone Encounter (Signed)
Left message that lab results where needed for appt on 05-21-23 and it would have to be rescheduled.

## 2023-05-21 ENCOUNTER — Telehealth (INDEPENDENT_AMBULATORY_CARE_PROVIDER_SITE_OTHER): Payer: Self-pay | Admitting: Nephrology

## 2023-05-21 ENCOUNTER — Encounter (INDEPENDENT_AMBULATORY_CARE_PROVIDER_SITE_OTHER): Payer: Medicare Other | Admitting: Nephrology

## 2023-05-21 NOTE — Telephone Encounter (Signed)
Left message that appt today would have to be rescheduled because of no labs done.

## 2023-07-12 ENCOUNTER — Other Ambulatory Visit (INDEPENDENT_AMBULATORY_CARE_PROVIDER_SITE_OTHER): Payer: Self-pay

## 2023-07-16 ENCOUNTER — Ambulatory Visit: Payer: Medicare Other | Admitting: PHYSICIAN ASSISTANT

## 2023-07-19 ENCOUNTER — Encounter (INDEPENDENT_AMBULATORY_CARE_PROVIDER_SITE_OTHER): Payer: Medicare Other | Admitting: Nephrology

## 2023-08-27 ENCOUNTER — Encounter (INDEPENDENT_AMBULATORY_CARE_PROVIDER_SITE_OTHER): Payer: Self-pay

## 2023-10-22 ENCOUNTER — Other Ambulatory Visit (HOSPITAL_PSYCHIATRIC): Payer: Self-pay | Admitting: PHYSICIAN ASSISTANT

## 2023-10-22 NOTE — Telephone Encounter (Signed)
 Patient needs appt
